# Patient Record
Sex: Male | Born: 1980 | Race: Black or African American | Hispanic: Yes | Marital: Married | Smoking: Never smoker
Health system: Southern US, Community
[De-identification: ages and names within clinical notes are randomized; demographics above are authoritative.]

## PROBLEM LIST (undated history)

## (undated) DIAGNOSIS — J45909 Unspecified asthma, uncomplicated: Secondary | ICD-10-CM

## (undated) HISTORY — PX: CATARACT EXTRACTION: SUR2

---

## 2006-10-21 ENCOUNTER — Emergency Department (HOSPITAL_COMMUNITY): Admission: EM | Admit: 2006-10-21 | Discharge: 2006-10-21 | Payer: Self-pay | Admitting: Emergency Medicine

## 2007-06-13 ENCOUNTER — Emergency Department (HOSPITAL_COMMUNITY): Admission: EM | Admit: 2007-06-13 | Discharge: 2007-06-13 | Payer: Self-pay | Admitting: Emergency Medicine

## 2018-09-26 ENCOUNTER — Encounter (HOSPITAL_COMMUNITY): Payer: Self-pay | Admitting: Urgent Care

## 2018-09-26 ENCOUNTER — Other Ambulatory Visit: Payer: Self-pay

## 2018-09-26 ENCOUNTER — Ambulatory Visit (HOSPITAL_COMMUNITY)
Admission: EM | Admit: 2018-09-26 | Discharge: 2018-09-26 | Disposition: A | Discharge status: Home / Self Care | Payer: 59 | Attending: Internal Medicine | Admitting: Internal Medicine

## 2018-09-26 DIAGNOSIS — B349 Viral infection, unspecified: Secondary | ICD-10-CM | POA: Diagnosis not present

## 2018-09-26 MED ORDER — DEXTROMETHORPHAN-BENZOCAINE 5-7.5 MG MT LOZG: 1.0000 | LOZENGE | OROMUCOSAL | 0 refills | Status: DC | PRN

## 2018-09-26 MED ORDER — SODIUM CHLORIDE 0.9 % IV BOLUS
1000.0000 mL | Freq: Once | INTRAVENOUS | Status: AC
  Administered 2018-09-26: 1000 mL via INTRAVENOUS

## 2018-09-26 NOTE — ED Triage Notes (Signed)
Pt cc coughing, headaches, body aches and fever  This started yesterday.

## 2018-09-26 NOTE — ED Provider Notes (Signed)
[facility]    CSN: [id number] Arrival date & time: 09/26/18  1609     History   Chief Complaint Chief Complaint  Patient presents with  . Influenza    HPI Mike Mason is a 38 y.o. male no past medical history comes to the urgent care department on account of 1 day history of runny nose, sore throat, generalized body aches with fever and chills.  Symptoms started yesterday and is gotten progressively worse today.  He admits to having a cough which is nonproductive.  No known aggravating or relieving factors.  Patient has not tried any over-the-counter medications.  He admits to having some dizziness with walking and had a near fainting episode earlier today.  He has no nausea or vomiting.   HPI  History reviewed. No pertinent past medical history.  There are no active problems to display for this patient.   History reviewed. No pertinent surgical history.     Home Medications    Prior to Admission medications   Medication Sig Start Date End Date Taking? Authorizing Provider  Dextromethorphan-Benzocaine (CEPACOL SORE THROAT & COUGH) 5-7.5 MG LOZG Use as directed 1 lozenge in the mouth or throat as needed. 09/26/18   Keishawn Darsey, Britta Mccreedy, MD    Family History History reviewed. No pertinent family history.  Social History Social History   Tobacco Use  . Smoking status: Never Smoker  . Smokeless tobacco: Never Used  Substance Use Topics  . Alcohol use: Never    Frequency: Never  . Drug use: Not on file     Allergies   Patient has no known allergies.   Review of Systems Review of Systems  Constitutional: Positive for chills and fatigue. Negative for activity change, appetite change and diaphoresis.  HENT: Positive for congestion and sore throat. Negative for ear discharge, ear pain, facial swelling and tinnitus.   Eyes: Negative for pain and itching.  Respiratory: Positive for cough. Negative for chest tightness, shortness of breath and wheezing.     Cardiovascular: Negative for chest pain and palpitations.  Gastrointestinal: Negative for abdominal distention and abdominal pain.  Musculoskeletal: Negative for arthralgias and gait problem.  Skin: Negative for color change, pallor and rash.  Neurological: Positive for dizziness and light-headedness. Negative for syncope.     Physical Exam Triage Vital Signs ED Triage Vitals  Enc Vitals Group     BP 09/26/18 1714 126/76     Pulse Rate 09/26/18 1714 (!) 106     Resp 09/26/18 1714 16     Temp 09/26/18 1714 98.3 F (36.8 C)     Temp src --      SpO2 09/26/18 1714 100 %     Weight 09/26/18 1715 156 lb (70.8 kg)     Height --      Head Circumference --      Peak Flow --      Pain Score 09/26/18 1715 7     Pain Loc --      Pain Edu? --      Excl. in GC? --    No data found.  Updated Vital Signs BP 126/76 (BP Location: Right Arm)   Pulse (!) 106   Temp 98.3 F (36.8 C)   Resp 16   Wt 70.8 kg   SpO2 100%   Visual Acuity Right Eye Distance:   Left Eye Distance:   Bilateral Distance:    Right Eye Near:   Left Eye Near:    Bilateral Near:  Physical Exam Constitutional:      Appearance: Normal appearance.  HENT:     Right Ear: Tympanic membrane normal.     Left Ear: Tympanic membrane normal.     Nose: Nose normal.     Mouth/Throat:     Mouth: Mucous membranes are dry.     Pharynx: No posterior oropharyngeal erythema.  Eyes:     Conjunctiva/sclera: Conjunctivae normal.     Pupils: Pupils are equal, round, and reactive to light.  Cardiovascular:     Rate and Rhythm: Tachycardia present.     Pulses: Normal pulses.     Heart sounds: No murmur. No gallop.   Pulmonary:     Effort: Pulmonary effort is normal.     Breath sounds: Normal breath sounds.  Abdominal:     General: Bowel sounds are normal. There is no distension.     Tenderness: There is no abdominal tenderness.  Musculoskeletal: Normal range of motion.  Lymphadenopathy:     Cervical: No cervical  adenopathy.  Skin:    General: Skin is warm.     Capillary Refill: Capillary refill takes less than 2 seconds.     Coloration: Skin is not jaundiced or pale.  Neurological:     Mental Status: He is alert.      UC Treatments / Results  Labs (all labs ordered are listed, but only abnormal results are displayed) Labs Reviewed - No data to display  EKG None  Radiology No results found.  Procedures Procedures (including critical care time)  Medications Ordered in UC Medications  sodium chloride 0.9 % bolus 1,000 mL (1,000 mLs Intravenous New Bag/Given 09/26/18 1753)    Initial Impression / Assessment and Plan / UC Course  I have reviewed the triage vital signs and the nursing notes.  Pertinent labs & imaging results that were available during my care of the patient were reviewed by me and considered in my medical decision making (see chart for details).     1.  Acute viral syndrome: Encourage oral fluid intake NSAIDs for pain/fever No indication for antibiotics at this time  2.  Sinus tachycardia likely secondary to acute viral illness: IV fluids-normal saline x1 L Discharge patient after IV fluids is complete  Final Clinical Impressions(s) / UC Diagnoses   Final diagnoses:  Viral illness   Discharge Instructions   None    ED Prescriptions    Medication Sig Dispense Auth. Provider   Dextromethorphan-Benzocaine (CEPACOL SORE THROAT & COUGH) 5-7.5 MG LOZG Use as directed 1 lozenge in the mouth or throat as needed.  Merrilee Jansky, MD     Controlled Substance Prescriptions [place] Controlled Substance Registry consulted? No   Merrilee Jansky, MD 09/26/18 [phone]

## 2019-02-13 ENCOUNTER — Ambulatory Visit (HOSPITAL_COMMUNITY)
Admission: EM | Admit: 2019-02-13 | Discharge: 2019-02-13 | Disposition: A | Discharge status: Home / Self Care | Payer: 59 | Attending: Family Medicine | Admitting: Family Medicine

## 2019-02-13 ENCOUNTER — Encounter (HOSPITAL_COMMUNITY): Payer: Self-pay | Admitting: Emergency Medicine

## 2019-02-13 ENCOUNTER — Other Ambulatory Visit: Payer: Self-pay

## 2019-02-13 DIAGNOSIS — L249 Irritant contact dermatitis, unspecified cause: Secondary | ICD-10-CM

## 2019-02-13 MED ORDER — METHYLPREDNISOLONE SODIUM SUCC 125 MG IJ SOLR
125.0000 mg | Freq: Once | INTRAMUSCULAR | Status: AC
  Administered 2019-02-13: 125 mg via INTRAMUSCULAR

## 2019-02-13 MED ORDER — PREDNISONE 10 MG (21) PO TBPK: ORAL_TABLET | Freq: Every day | ORAL | 0 refills | Status: DC

## 2019-02-13 MED ORDER — CETIRIZINE HCL 10 MG PO CAPS: 10.0000 mg | ORAL_CAPSULE | Freq: Every day | ORAL | 0 refills | Status: DC

## 2019-02-13 MED ORDER — METHYLPREDNISOLONE SODIUM SUCC 125 MG IJ SOLR
INTRAMUSCULAR | Status: AC
  Filled 2019-02-13: qty 2

## 2019-02-13 MED ORDER — CEPHALEXIN 500 MG PO CAPS: 500.0000 mg | ORAL_CAPSULE | Freq: Four times a day (QID) | ORAL | 0 refills | Status: AC

## 2019-02-13 MED ORDER — TRIAMCINOLONE ACETONIDE 0.1 % EX CREA: 1.0000 "application " | TOPICAL_CREAM | Freq: Two times a day (BID) | CUTANEOUS | 0 refills | Status: DC

## 2019-02-13 NOTE — Discharge Instructions (Signed)
We gave you a shot of Solu-Medrol today, continue with prednisone taper beginning tomorrow.  Begin with 6 tablets, decrease by 1 tablet each day until you take 1 tablet on day 6 Begin Keflex 4 times a day for the next 5 days to cover infection May apply Kenalog cream twice daily and thin amount to areas of significant itching May also use antihistamines to further help with itching-Daily cetirizine/Zyrtec in the morning, may supplement with Benadryl at nighttime  Please follow-up if rash changing, worsening, developing fevers, nausea, vomiting, increased redness swelling or pain, drainage, not improving with above

## 2019-02-13 NOTE — ED Triage Notes (Signed)
Pt here for rash to arms after cutting down tree

## 2019-02-13 NOTE — ED Provider Notes (Signed)
[facility]    CSN: [id number] Arrival date & time: 02/13/19  [id number]      History   Chief Complaint Chief Complaint  Patient presents with   Rash    HPI Mike Mason is a 38 y.o. male no significant past medical history presenting today for evaluation of a rash.  Patient states that on 6/12, approximately 10 days ago he was cutting down a tree/brush.  Afterwards he developed small bumps to his forearms.  Since this rash has progressed and has become more red and noted more swelling to his left forearm.  Rash is mainly been associated with itching, but of recently has developed slightly more pain.  Has also noticed rash on his right forearm as well as left side.  He has been using calamine as well as over-the-counter hydrocortisone without full relief.  Denies any fevers chills or body aches.  Denies nausea or vomiting.  HPI  History reviewed. No pertinent past medical history.  There are no active problems to display for this patient.   History reviewed. No pertinent surgical history.     Home Medications    Prior to Admission medications   Medication Sig Start Date End Date Taking? Authorizing Provider  cephALEXin (KEFLEX) 500 MG capsule Take 1 capsule (500 mg total) by mouth 4 (four) times daily for 5 days. 02/13/19 02/18/19  Tramayne Sebesta C, PA-C  Cetirizine HCl 10 MG CAPS Take 1 capsule (10 mg total) by mouth daily for 10 days. 02/13/19 02/23/19  Gabriela Giannelli C, PA-C  Dextromethorphan-Benzocaine (CEPACOL SORE THROAT & COUGH) 5-7.5 MG LOZG Use as directed 1 lozenge in the mouth or throat as needed. 09/26/18   Lamptey, Britta MccreedyPhilip O, MD  predniSONE (STERAPRED UNI-PAK 21 TAB) 10 MG (21) TBPK tablet Take by mouth daily. Take as directed 02/13/19   Kaycee Mcgaugh C, PA-C  triamcinolone cream (KENALOG) 0.1 % Apply 1 application topically 2 (two) times daily. 02/13/19   Lindora Alviar, Junius CreamerHallie C, PA-C    Family History Family History  Problem Relation Age of Onset   Healthy  Mother    Healthy Father     Social History Social History   Tobacco Use   Smoking status: Never Smoker   Smokeless tobacco: Never Used  Substance Use Topics   Alcohol use: Never    Frequency: Never   Drug use: Not on file     Allergies   Patient has no known allergies.   Review of Systems Review of Systems  Constitutional: Negative for fatigue and fever.  Eyes: Negative for redness, itching and visual disturbance.  Respiratory: Negative for shortness of breath.   Cardiovascular: Negative for chest pain and leg swelling.  Gastrointestinal: Negative for nausea and vomiting.  Musculoskeletal: Negative for arthralgias and myalgias.  Skin: Positive for color change and rash. Negative for wound.  Neurological: Negative for dizziness, syncope, weakness, light-headedness and headaches.     Physical Exam Triage Vital Signs ED Triage Vitals [02/13/19 1021]  Enc Vitals Group     BP (!) 126/99     Pulse Rate 82     Resp 18     Temp 98.4 F (36.9 C)     Temp Source Oral     SpO2 99 %     Weight      Height      Head Circumference      Peak Flow      Pain Score 3     Pain Loc  Pain Edu?      Excl. in [place]?    No data found.  Updated Vital Signs BP (!) 126/99 (BP Location: Right Arm)    Pulse 82    Temp 98.4 F (36.9 C) (Oral)    Resp 18    SpO2 99%   Visual Acuity Right Eye Distance:   Left Eye Distance:   Bilateral Distance:    Right Eye Near:   Left Eye Near:    Bilateral Near:     Physical Exam Vitals signs and nursing note reviewed.  Constitutional:      Appearance: He is well-developed.     Comments: No acute distress  HENT:     Head: Normocephalic and atraumatic.     Nose: Nose normal.     Mouth/Throat:     Comments: Oral mucosa pink and moist, no tonsillar enlargement or exudate. Posterior pharynx patent and nonerythematous, no uvula deviation or swelling. Normal phonation. No lesions on oral mucosa Eyes:     Conjunctiva/sclera:  Conjunctivae normal.     Comments: Wearing glasses  Neck:     Musculoskeletal: Neck supple.  Cardiovascular:     Rate and Rhythm: Normal rate.  Pulmonary:     Effort: Pulmonary effort is normal. No respiratory distress.     Comments: Breathing comfortably at rest, CTABL, no wheezing, rales or other adventitious sounds auscultated Abdominal:     General: There is no distension.  Musculoskeletal: Normal range of motion.  Skin:    General: Skin is warm and dry.     Comments: Left forearm with multiple clustered area of vesicular lesions with surrounding erythema, mild swelling noted-see pictures below  Small area of erythematous vesicular/papular lesions to right forearm as well as across left flank/abdomen.  Neurological:     Mental Status: He is alert and oriented to person, place, and time.          UC Treatments / Results  Labs (all labs ordered are listed, but only abnormal results are displayed) Labs Reviewed - No data to display  EKG None  Radiology No results found.  Procedures Procedures (including critical care time)  Medications Ordered in UC Medications  methylPREDNISolone sodium succinate (SOLU-MEDROL) 125 mg/2 mL injection 125 mg (125 mg Intramuscular Given 02/13/19 1056)  methylPREDNISolone sodium succinate (SOLU-MEDROL) 125 mg/2 mL injection (has no administration in time range)    Initial Impression / Assessment and Plan / UC Course  I have reviewed the triage vital signs and the nursing notes.  Pertinent labs & imaging results that were available during my care of the patient were reviewed by me and considered in my medical decision making (see chart for details).     Appears to have a contact dermatitis, similar appearance to poison ivy given linear distribution on left forearm and clustered vesicular appearance.  Significant erythema associated, will cover for infection with Keflex.  Will provide Solu-Medrol IM today and followed by prednisone  taper over 6 days.  Antihistamines, topical Kenalog as needed for significant areas of itching.  Continue to monitor,Discussed strict return precautions. Patient verbalized understanding and is agreeable with plan.  Final Clinical Impressions(s) / UC Diagnoses   Final diagnoses:  Irritant contact dermatitis, unspecified trigger     Discharge Instructions     We gave you a shot of Solu-Medrol today, continue with prednisone taper beginning tomorrow.  Begin with 6 tablets, decrease by 1 tablet each day until you take 1 tablet on day 6 Begin Keflex 4 times a  day for the next 5 days to cover infection May apply Kenalog cream twice daily and thin amount to areas of significant itching May also use antihistamines to further help with itching-Daily cetirizine/Zyrtec in the morning, may supplement with Benadryl at nighttime  Please follow-up if rash changing, worsening, developing fevers, nausea, vomiting, increased redness swelling or pain, drainage, not improving with above   ED Prescriptions    Medication Sig Dispense Auth. Provider   predniSONE (STERAPRED UNI-PAK 21 TAB) 10 MG (21) TBPK tablet Take by mouth daily. Take as directed 21 tablet Calleen Alvis C, PA-C   triamcinolone cream (KENALOG) 0.1 % Apply 1 application topically 2 (two) times daily. 30 g Shiquan Mathieu C, PA-C   Cetirizine HCl 10 MG CAPS Take 1 capsule (10 mg total) by mouth daily for 10 days. 10 capsule Souleymane Saiki C, PA-C   cephALEXin (KEFLEX) 500 MG capsule Take 1 capsule (500 mg total) by mouth 4 (four) times daily for 5 days. 20 capsule Bobbie Virden C, PA-C     Controlled Substance Prescriptions [place] Controlled Substance Registry consulted? Not Applicable   Lew DawesWieters, Ammiel Guiney C, [place]PA-C 02/13/19 1232

## 2019-04-07 ENCOUNTER — Encounter (HOSPITAL_COMMUNITY): Payer: Self-pay

## 2019-04-07 ENCOUNTER — Other Ambulatory Visit: Payer: Self-pay

## 2019-04-07 ENCOUNTER — Ambulatory Visit (HOSPITAL_COMMUNITY)
Admission: EM | Admit: 2019-04-07 | Discharge: 2019-04-07 | Disposition: A | Discharge status: Home / Self Care | Payer: 59 | Attending: Urgent Care | Admitting: Urgent Care

## 2019-04-07 DIAGNOSIS — Z20828 Contact with and (suspected) exposure to other viral communicable diseases: Secondary | ICD-10-CM | POA: Insufficient documentation

## 2019-04-07 DIAGNOSIS — B029 Zoster without complications: Secondary | ICD-10-CM | POA: Insufficient documentation

## 2019-04-07 HISTORY — DX: Unspecified asthma, uncomplicated: J45.909

## 2019-04-07 MED ORDER — VALACYCLOVIR HCL 1 G PO TABS: 1000.0000 mg | ORAL_TABLET | Freq: Three times a day (TID) | ORAL | 0 refills | Status: DC

## 2019-04-07 NOTE — ED Triage Notes (Signed)
Pt presents with complaints of swelling and tenderness to his groin. Requesting a male provider. States that he has also had fevers, chills, body aches and elevated heart rate x 4 days.

## 2019-04-07 NOTE — ED Provider Notes (Signed)
[facility]    CSN: [id number] Arrival date & time: 04/07/19  1445      History   Chief Complaint Chief Complaint  Patient presents with  . Groin Swelling    HPI Mike Mason is a 38 y.o. male.   Patient here concerned with ulcer on penis x 4 - 5 days.  Notes additional small "bumps" appeared around it.  Notes his R leg is painful and tender to light touch.  Denies any lesions on leg.  Notes R inguinal tenderness as well. He reports chills without fever, myalgia, and "elevated heart rate" which he defines as 90 BPM per his heart rate monitor on his watch. Denies dysuria, frequency, urgency, penile d/c, testicular swelling, erythema ,discharge.  Patient reports sexually active, 1 male partner.  He reports his wife is also faithful.  No known exposures to STDs.       Past Medical History:  Diagnosis Date  . Asthma     There are no active problems to display for this patient.   History reviewed. No pertinent surgical history.     Home Medications    Prior to Admission medications   Medication Sig Start Date End Date Taking? Authorizing Provider  Cetirizine HCl 10 MG CAPS Take 1 capsule (10 mg total) by mouth daily for 10 days. 02/13/19 02/23/19  Wieters, Hallie C, Mike Mason  Dextromethorphan-Benzocaine (CEPACOL SORE THROAT & COUGH) 5-7.5 MG LOZG Use as directed 1 lozenge in the mouth or throat as needed. 09/26/18   Lamptey, Britta MccreedyPhilip O, MD  predniSONE (STERAPRED UNI-PAK 21 TAB) 10 MG (21) TBPK tablet Take by mouth daily. Take as directed 02/13/19   Wieters, Hallie C, Mike Mason  triamcinolone cream (KENALOG) 0.1 % Apply 1 application topically 2 (two) times daily. 02/13/19   Wieters, Hallie C, Mike Mason  valACYclovir (VALTREX) 1000 MG tablet Take 1 tablet (1,000 mg total) by mouth 3 (three) times daily. 04/07/19   Evern CoreLindquist, Benton Tooker, Mike Mason    Family History Family History  Problem Relation Age of Onset  . Healthy Mother   . Healthy Father     Social History Social History    Tobacco Use  . Smoking status: Never Smoker  . Smokeless tobacco: Never Used  Substance Use Topics  . Alcohol use: Never    Frequency: Never  . Drug use: Not on file     Allergies   Patient has no known allergies.   Review of Systems Review of Systems  Constitutional: Positive for chills. Negative for activity change and fatigue.  Cardiovascular: Negative for chest pain, palpitations and leg swelling.  Gastrointestinal: Negative for abdominal distention, abdominal pain, diarrhea, nausea and vomiting.  Genitourinary: Positive for genital sores and penile pain. Negative for decreased urine volume, difficulty urinating, discharge, dysuria, flank pain, frequency, hematuria, penile swelling, scrotal swelling, testicular pain and urgency.  Musculoskeletal: Positive for myalgias. Negative for arthralgias.  Skin: Positive for rash. Negative for color change, pallor and wound.  Neurological: Negative for dizziness and light-headedness.  Psychiatric/Behavioral: Negative for confusion and sleep disturbance.     Physical Exam Triage Vital Signs ED Triage Vitals  Enc Vitals Group     BP 04/07/19 1544 (!) 149/105     Pulse Rate 04/07/19 1544 95     Resp 04/07/19 1544 18     Temp 04/07/19 1544 98.9 F (37.2 C)     Temp src --      SpO2 04/07/19 1544 100 %     Weight --  Height --      Head Circumference --      Peak Flow --      Pain Score 04/07/19 1545 10     Pain Loc --      Pain Edu? --      Excl. in GC? --    No data found.  Updated Vital Signs BP (!) 149/105   Pulse 95   Temp 98.9 F (37.2 C)   Resp 18   SpO2 100%   Visual Acuity Right Eye Distance:   Left Eye Distance:   Bilateral Distance:    Right Eye Near:   Left Eye Near:    Bilateral Near:     Physical Exam Vitals signs and nursing note reviewed.  Constitutional:      Appearance: Normal appearance. He is well-developed.  HENT:     Head: Normocephalic and atraumatic.     Nose: Nose normal.   Eyes:     General: No scleral icterus.    Extraocular Movements: Extraocular movements intact.     Conjunctiva/sclera: Conjunctivae normal.     Pupils: Pupils are equal, round, and reactive to light.  Neck:     Musculoskeletal: Neck supple.  Cardiovascular:     Rate and Rhythm: Normal rate and regular rhythm.     Heart sounds: No murmur.  Pulmonary:     Effort: Pulmonary effort is normal. No respiratory distress.     Breath sounds: Normal breath sounds.  Abdominal:     Palpations: Abdomen is soft.     Tenderness: There is no abdominal tenderness.  Genitourinary:    Penis: Lesions (3 mm ulcerative lesion shaft of R side of penis with 3 small nodules noted, TTP, no expressable d/c noted) present.      Scrotum/Testes:        Right: Mass, tenderness or swelling not present.        Left: Mass, tenderness or swelling not present.  Musculoskeletal: Normal range of motion.  Lymphadenopathy:     Lower Body: Right inguinal adenopathy present.  Skin:    General: Skin is warm and dry.     Capillary Refill: Capillary refill takes less than 2 seconds.  Neurological:     General: No focal deficit present.     Mental Status: He is alert and oriented to person, place, and time.     Motor: No weakness.     Gait: Gait normal.  Psychiatric:        Mood and Affect: Mood is anxious.        Speech: Speech normal.        Behavior: Behavior normal.        Thought Content: Thought content normal.      UC Treatments / Results  Labs (all labs ordered are listed, but only abnormal results are displayed) Labs Reviewed  NOVEL CORONAVIRUS, NAA (HOSPITAL ORDER, SEND-OUT TO REF LAB)  HSV CULTURE AND TYPING  RPR  HIV ANTIBODY (ROUTINE TESTING W REFLEX)  HSV(HERPES SIMPLEX VRS) I + II AB-IGM  HSV(HERPES SIMPLEX VRS) I + II AB-IGG  URINE CYTOLOGY ANCILLARY ONLY    EKG   Radiology No results found.  Procedures Procedures (including critical care time)  Medications Ordered in UC Medications  - No data to display  Initial Impression / Assessment and Plan / UC Course  I have reviewed the triage vital signs and the nursing notes.  Pertinent labs & imaging results that were available during my care of the patient were reviewed  by me and considered in my medical decision making (see chart for details).     Will complete STD testing and swab for HSV Discussed possibility of STDs with patient, including chancroid. Will treat for zoster at this time due to multiple small lesions and numbness / tingling of R anterior thigh, pending lab results Final Clinical Impressions(s) / UC Diagnoses   Final diagnoses:  Herpes zoster without complication     Discharge Instructions     Treating for shingles based on symptoms. We will call with test results.    ED Prescriptions    Medication Sig Dispense Auth. Provider   valACYclovir (VALTREX) 1000 MG tablet Take 1 tablet (1,000 mg total) by mouth 3 (three) times daily. 21 tablet Mike Jefferson, Mike Mason     Controlled Substance Prescriptions [place] Controlled Substance Registry consulted? Not Applicable   Mike Jefferson, Mike Mason 04/07/19 1640

## 2019-04-07 NOTE — Discharge Instructions (Addendum)
Treating for shingles based on symptoms. We will call with test results.

## 2019-04-08 LAB — RPR: RPR Ser Ql: NONREACTIVE

## 2019-04-08 LAB — HIV ANTIBODY (ROUTINE TESTING W REFLEX): HIV Screen 4th Generation wRfx: NONREACTIVE

## 2019-04-09 LAB — NOVEL CORONAVIRUS, NAA (HOSP ORDER, SEND-OUT TO REF LAB; TAT 18-24 HRS): SARS-CoV-2, NAA: NOT DETECTED

## 2019-04-10 LAB — HSV CULTURE AND TYPING

## 2019-04-10 LAB — HSV(HERPES SIMPLEX VRS) I + II AB-IGG
HSV 1 Glycoprotein G Ab, IgG: <0.91 index (ref 0.00–0.90)
HSV 2 Glycoprotein G Ab, IgG: <0.91 index (ref 0.00–0.90)

## 2019-04-11 LAB — HSV(HERPES SIMPLEX VRS) I + II AB-IGM: HSVI/II Comb IgM: 1.04 Ratio — ABNORMAL HIGH (ref 0.00–0.90)

## 2019-04-11 LAB — URINE CYTOLOGY ANCILLARY ONLY
Chlamydia: NEGATIVE
Neisseria Gonorrhea: NEGATIVE
Trichomonas: NEGATIVE

## 2020-01-21 ENCOUNTER — Other Ambulatory Visit: Payer: Self-pay

## 2020-01-21 ENCOUNTER — Encounter (HOSPITAL_BASED_OUTPATIENT_CLINIC_OR_DEPARTMENT_OTHER): Payer: Self-pay | Admitting: *Deleted

## 2020-01-21 ENCOUNTER — Emergency Department (HOSPITAL_BASED_OUTPATIENT_CLINIC_OR_DEPARTMENT_OTHER)
Admission: EM | Admit: 2020-01-21 | Discharge: 2020-01-21 | Disposition: A | Discharge status: Home / Self Care | Payer: 59 | Attending: Emergency Medicine | Admitting: Emergency Medicine

## 2020-01-21 ENCOUNTER — Emergency Department (HOSPITAL_BASED_OUTPATIENT_CLINIC_OR_DEPARTMENT_OTHER): Payer: 59

## 2020-01-21 DIAGNOSIS — R1032 Left lower quadrant pain: Secondary | ICD-10-CM | POA: Insufficient documentation

## 2020-01-21 DIAGNOSIS — J45909 Unspecified asthma, uncomplicated: Secondary | ICD-10-CM | POA: Diagnosis not present

## 2020-01-21 DIAGNOSIS — R109 Unspecified abdominal pain: Secondary | ICD-10-CM

## 2020-01-21 LAB — URINALYSIS, ROUTINE W REFLEX MICROSCOPIC
Bilirubin Urine: NEGATIVE
Glucose, UA: 100 mg/dL — AB
Ketones, ur: NEGATIVE mg/dL
Leukocytes,Ua: NEGATIVE
Nitrite: NEGATIVE
Protein, ur: NEGATIVE mg/dL
Specific Gravity, Urine: 1.025 (ref 1.005–1.030)
pH: 6 (ref 5.0–8.0)

## 2020-01-21 LAB — URINALYSIS, MICROSCOPIC (REFLEX): WBC, UA: NONE SEEN WBC/hpf (ref 0–5)

## 2020-01-21 MED ORDER — CIPROFLOXACIN HCL 500 MG PO TABS
500.0000 mg | ORAL_TABLET | Freq: Once | ORAL | Status: AC
  Administered 2020-01-21: 500 mg via ORAL
  Filled 2020-01-21: qty 1

## 2020-01-21 MED ORDER — CIPROFLOXACIN HCL 500 MG PO TABS: 500.0000 mg | ORAL_TABLET | Freq: Two times a day (BID) | ORAL | 0 refills | Status: AC

## 2020-01-21 MED ORDER — METRONIDAZOLE 500 MG PO TABS: 500.0000 mg | ORAL_TABLET | Freq: Three times a day (TID) | ORAL | 0 refills | Status: AC

## 2020-01-21 MED ORDER — METRONIDAZOLE 500 MG PO TABS
500.0000 mg | ORAL_TABLET | Freq: Once | ORAL | Status: AC
  Administered 2020-01-21: 500 mg via ORAL
  Filled 2020-01-21: qty 1

## 2020-01-21 NOTE — ED Provider Notes (Signed)
[facility] EMERGENCY DEPARTMENT Provider Note   CSN: [id number] Arrival date & time: 01/21/20  1730     History Chief Complaint  Patient presents with  . Flank Pain    Mike Mason is a 39 y.o. male presented to emergency department with left lower sided back pain and flank pain.  Reports onset approximately 2 days ago.  No reported trauma.  He is having a sharp and cramping pain in his left flank which radiates around towards his left lower abdomen.  He said he had 2 bowel movements yesterday and felt like the stool color was a different consistency.  He reports that he has had similar episodes intermittently in the past of abdominal cramping pain, usually relieved after bowel movement.  He does state that he is lactose intolerant and had 2 pieces of [employer] cheesecake the past 2 days.  He is not sure whether this may be contributing to his symptoms.  He also reports he was seen in another emergency department 2 or 3 years ago and had a CT scan and was told "there is something up with my intestines".  He does not recall the diagnosis.  He denies any fevers or chills.  He denies any vomiting.  He does intermittently have bloating, worse in the morning.  He denies any personal family history of kidney stones.  He denies any dysuria.  HPI     Past Medical History:  Diagnosis Date  . Asthma     There are no problems to display for this patient.   History reviewed. No pertinent surgical history.     Family History  Problem Relation Age of Onset  . Healthy Mother   . Healthy Father     Social History   Tobacco Use  . Smoking status: Never Smoker  . Smokeless tobacco: Never Used  Substance Use Topics  . Alcohol use: Never  . Drug use: Not on file    Home Medications Prior to Admission medications   Medication Sig Start Date End Date Taking? Authorizing Provider  Cetirizine HCl 10 MG CAPS Take 1 capsule (10 mg total) by mouth daily for 10 days. 02/13/19  02/23/19  Wieters, Hallie C, PA-C  ciprofloxacin (CIPRO) 500 MG tablet Take 1 tablet (500 mg total) by mouth every 12 (twelve) hours for 6 days. 01/22/20 01/28/20  Terald Sleeper, MD  Dextromethorphan-Benzocaine (CEPACOL SORE THROAT & COUGH) 5-7.5 MG LOZG Use as directed 1 lozenge in the mouth or throat as needed. 09/26/18   Lamptey, Britta Mccreedy, MD  metroNIDAZOLE (FLAGYL) 500 MG tablet Take 1 tablet (500 mg total) by mouth 3 (three) times daily for 6 days. 01/22/20 01/28/20  Terald Sleeper, MD  predniSONE (STERAPRED UNI-PAK 21 TAB) 10 MG (21) TBPK tablet Take by mouth daily. Take as directed 02/13/19   Wieters, Hallie C, PA-C  triamcinolone cream (KENALOG) 0.1 % Apply 1 application topically 2 (two) times daily. 02/13/19   Wieters, Hallie C, PA-C  valACYclovir (VALTREX) 1000 MG tablet Take 1 tablet (1,000 mg total) by mouth 3 (three) times daily. 04/07/19   Evern Core, PA-C    Allergies    Patient has no known allergies.  Review of Systems   Review of Systems  Constitutional: Negative for chills and fever.  Eyes: Negative for pain and visual disturbance.  Respiratory: Negative for cough and shortness of breath.   Cardiovascular: Negative for chest pain and palpitations.  Gastrointestinal: Positive for abdominal pain and blood in stool. Negative for  nausea and vomiting.  Genitourinary: Positive for flank pain. Negative for dysuria, hematuria, penile swelling and scrotal swelling.  Musculoskeletal: Positive for back pain. Negative for arthralgias.  Skin: Negative for color change and rash.  Neurological: Negative for syncope and light-headedness.  Psychiatric/Behavioral: Negative for agitation and confusion.  All other systems reviewed and are negative.   Physical Exam Updated Vital Signs BP (!) 113/92 (BP Location: Right Arm)   Pulse 79   Temp 98.9 F (37.2 C)   Resp 16   Ht 5\' 7"  (1.702 m)   Wt 70.8 kg   SpO2 99%   BMI 24.43 kg/m   Physical Exam Vitals and nursing note reviewed.   Constitutional:      Appearance: He is well-developed.  HENT:     Head: Normocephalic and atraumatic.  Eyes:     Conjunctiva/sclera: Conjunctivae normal.  Cardiovascular:     Rate and Rhythm: Normal rate and regular rhythm.     Pulses: Normal pulses.  Pulmonary:     Effort: Pulmonary effort is normal. No respiratory distress.  Abdominal:     General: There is no distension.     Palpations: Abdomen is soft.     Tenderness: There is no abdominal tenderness. There is no right CVA tenderness, left CVA tenderness or guarding.  Musculoskeletal:        General: No swelling or tenderness. Normal range of motion.     Cervical back: Neck supple.  Skin:    General: Skin is warm and dry.  Neurological:     General: No focal deficit present.     Mental Status: He is alert and oriented to person, place, and time.  Psychiatric:        Mood and Affect: Mood normal.        Behavior: Behavior normal.     ED Results / Procedures / Treatments   Labs (all labs ordered are listed, but only abnormal results are displayed) Labs Reviewed  URINALYSIS, ROUTINE W REFLEX MICROSCOPIC - Abnormal; Notable for the following components:      Result Value   Glucose, UA 100 (*)    Hgb urine dipstick SMALL (*)    All other components within normal limits  URINALYSIS, MICROSCOPIC (REFLEX) - Abnormal; Notable for the following components:   Bacteria, UA RARE (*)    All other components within normal limits    EKG None  Radiology CT Renal Stone Study  Result Date: 01/21/2020 CLINICAL DATA:  Per ED notes: Left flank pain x 2 days. Increased pain with palpation, movement. EXAM: CT ABDOMEN AND PELVIS WITHOUT CONTRAST TECHNIQUE: Multidetector CT imaging of the abdomen and pelvis was performed following the standard protocol without IV contrast. COMPARISON:  None. FINDINGS: Lower chest: No acute abnormality. Evaluation of the abdominal viscera somewhat limited by the lack of IV contrast. Hepatobiliary: No focal  liver abnormality is seen. No gallstones, gallbladder wall thickening, or biliary dilatation. Pancreas: Unremarkable. No surrounding inflammatory changes. Spleen: Normal in size without focal abnormality. Adrenals/Urinary Tract: Adrenal glands are unremarkable. Kidneys are symmetric without renal calculi or hydronephrosis. Bladder is unremarkable. Stomach/Bowel: Stomach is within normal limits. Appendix appears normal. No evidence of bowel wall thickening, distention, or inflammatory changes. Vascular/Lymphatic: No significant vascular findings are present. No enlarged abdominal or pelvic lymph nodes. Reproductive: Prostate is unremarkable. Other: No abdominal wall hernia or abnormality. No abdominopelvic ascites. Musculoskeletal: Degenerative disc disease in the lower lumbar spine. IMPRESSION: No evidence of acute intra-abdominal pathology on a noncontrast study. Electronically Signed  By: Audie Pinto M.D.   On: 01/21/2020 20:03    Procedures Procedures (including critical care time)  Medications Ordered in ED Medications  ciprofloxacin (CIPRO) tablet 500 mg (500 mg Oral Given 01/21/20 2044)  metroNIDAZOLE (FLAGYL) tablet 500 mg (500 mg Oral Given 01/21/20 2044)    ED Course  I have reviewed the triage vital signs and the nursing notes.  Pertinent labs & imaging results that were available during my care of the patient were reviewed by me and considered in my medical decision making (see chart for details).  39 yo male presenting with LLQ abdominal pain and flank pain. Trace hgb in urine today Clinically this could be consistent with kidney stone.  Ordered CT renal study which showed no stone, and no focal abnormalities. Not protocalized for bowel evaluation - diverticulitis/colitis remains on the differential.  He was extremely well appearing with minimal to no ttp on exam.  I did not feel we need bloodwork but felt we could treat empircally with a course of antibiotics for colitis.     This may also have been IBS or lactose-intolerance reaction to the cheesecake he ate recently.   Doubtful of appendicitis, testicular torsion, pyelonephritis, or AAA  Clinical Course as of Jan 20 2250  Sun Jan 21, 2020  2018 IMPRESSION: No evidence of acute intra-abdominal pathology on a noncontrast study.   [MT]  2036 No acute findings of kidney stone on CT.  However based on the patient's description location of pain, I do think colitis remains on the differential.  Reasonable to try a course of 7 days of cipro + flagyl.  He is concerned about the chronic pain has been having his lower back.  I do think needs establish care with his PCP and will give him the wellness clinic.  He verbalized understanding.   [MT]    Clinical Course User Index [MT] Trifan, Carola Rhine, MD    Final Clinical Impression(s) / ED Diagnoses Final diagnoses:  Flank pain    Rx / DC Orders ED Discharge Orders         Ordered    ciprofloxacin (CIPRO) 500 MG tablet  Every 12 hours     01/21/20 2040    metroNIDAZOLE (FLAGYL) 500 MG tablet  3 times daily     01/21/20 2040           Wyvonnia Dusky, MD 01/21/20 2251

## 2020-01-21 NOTE — ED Notes (Signed)
Pt to CT

## 2020-01-21 NOTE — ED Triage Notes (Signed)
Left flank pain x 2 days. Increased pain with palpation, movement.

## 2020-01-21 NOTE — Discharge Instructions (Addendum)
Your CT scan today did not show kidney stones or signs of tumor.  However, this is not an exhaustive workup for back pain.  You should establish care with a primary care provider for more workup.  Call the wellness center at the number above.  We agreed to start treatment for possible diverticulitis today - this means inflammation of your bowels.  You can complete the full course of antibiotics at home, starting tomorrow morning.  Do NOT drink alcohol while taking Flagyl.  It will make you very sick.

## 2020-05-15 ENCOUNTER — Ambulatory Visit: Payer: 59 | Admitting: Psychology

## 2020-05-21 ENCOUNTER — Ambulatory Visit (INDEPENDENT_AMBULATORY_CARE_PROVIDER_SITE_OTHER): Payer: 59 | Admitting: Psychologist

## 2020-05-21 DIAGNOSIS — F411 Generalized anxiety disorder: Secondary | ICD-10-CM | POA: Diagnosis not present

## 2020-05-21 DIAGNOSIS — F330 Major depressive disorder, recurrent, mild: Secondary | ICD-10-CM | POA: Diagnosis not present

## 2020-05-31 ENCOUNTER — Ambulatory Visit (INDEPENDENT_AMBULATORY_CARE_PROVIDER_SITE_OTHER): Payer: 59 | Admitting: Psychologist

## 2020-05-31 DIAGNOSIS — F411 Generalized anxiety disorder: Secondary | ICD-10-CM | POA: Diagnosis not present

## 2020-05-31 DIAGNOSIS — F330 Major depressive disorder, recurrent, mild: Secondary | ICD-10-CM

## 2020-06-06 ENCOUNTER — Ambulatory Visit (INDEPENDENT_AMBULATORY_CARE_PROVIDER_SITE_OTHER): Payer: 59 | Admitting: Psychologist

## 2020-06-06 DIAGNOSIS — F330 Major depressive disorder, recurrent, mild: Secondary | ICD-10-CM

## 2020-06-06 DIAGNOSIS — F411 Generalized anxiety disorder: Secondary | ICD-10-CM

## 2020-06-11 ENCOUNTER — Ambulatory Visit: Payer: 59 | Admitting: Psychologist

## 2020-06-18 ENCOUNTER — Ambulatory Visit (INDEPENDENT_AMBULATORY_CARE_PROVIDER_SITE_OTHER): Payer: 59 | Admitting: Psychologist

## 2020-06-18 DIAGNOSIS — F330 Major depressive disorder, recurrent, mild: Secondary | ICD-10-CM

## 2020-06-18 DIAGNOSIS — F411 Generalized anxiety disorder: Secondary | ICD-10-CM | POA: Diagnosis not present

## 2020-07-02 ENCOUNTER — Ambulatory Visit: Payer: 59 | Admitting: Psychologist

## 2020-12-27 IMAGING — CT CT RENAL STONE PROTOCOL
2 of 4 series · 17 of 46 positions shown, 19 images · non-contrast
Comparison: None.

CLINICAL DATA: Per ED notes: Left flank pain x 2 days. Increased
pain with palpation, movement.

EXAM:
CT ABDOMEN AND PELVIS WITHOUT CONTRAST
TECHNIQUE: Multidetector CT imaging of the abdomen and pelvis was performed
following the standard protocol without IV contrast.

[Series 2: axial st · axial · 0.74mm/px · z∈[+552,+946]mm · 14 of 87 slices shown, 16 images]
[im 4/87  soft-tissue]
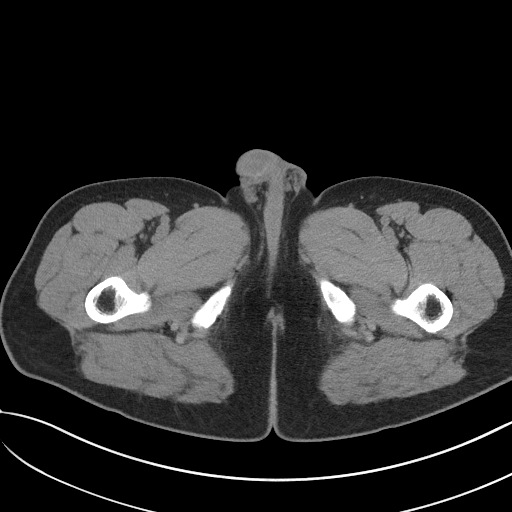
[im 4/87  bone]
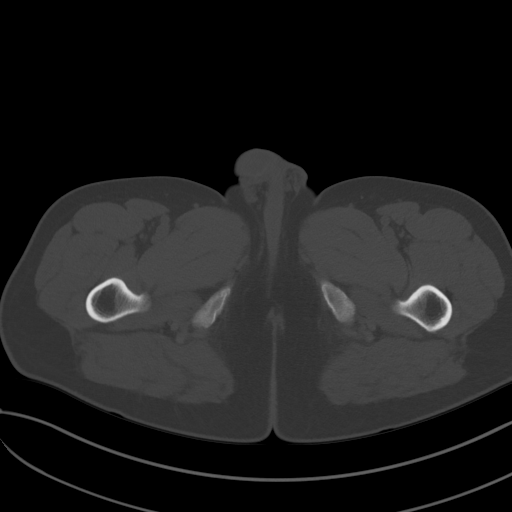
[im 11/87  soft-tissue]
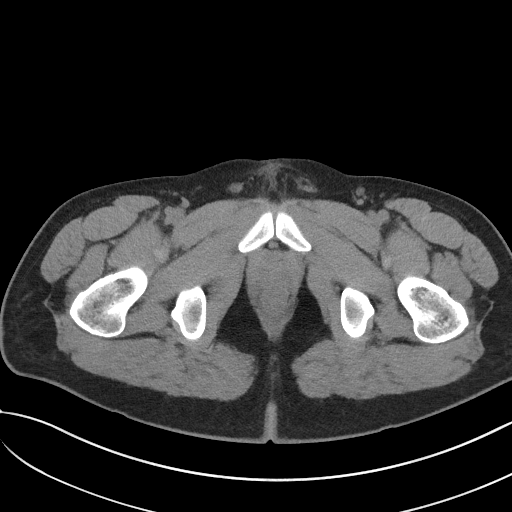
[im 18/87  soft-tissue]
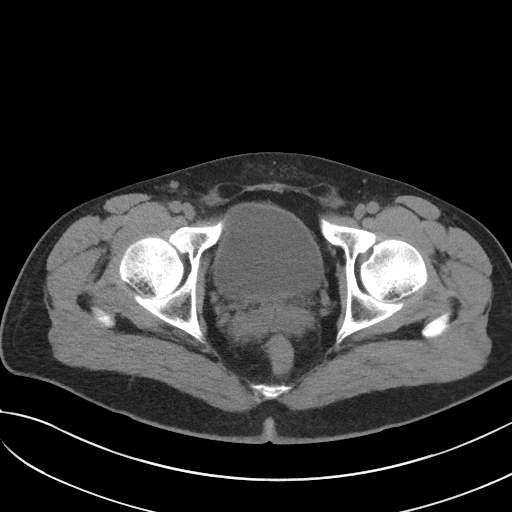
[im 22/87  soft-tissue]
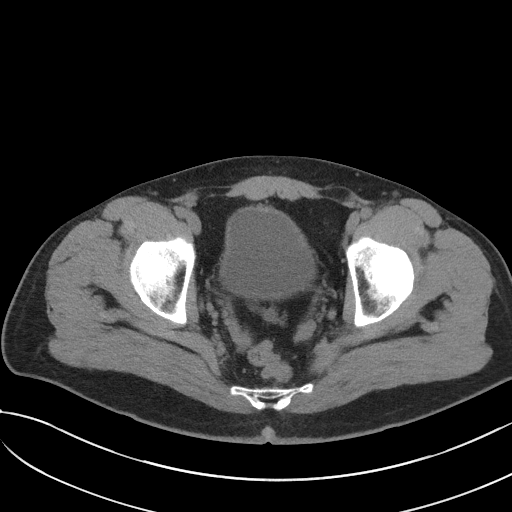
[im 29/87  soft-tissue]
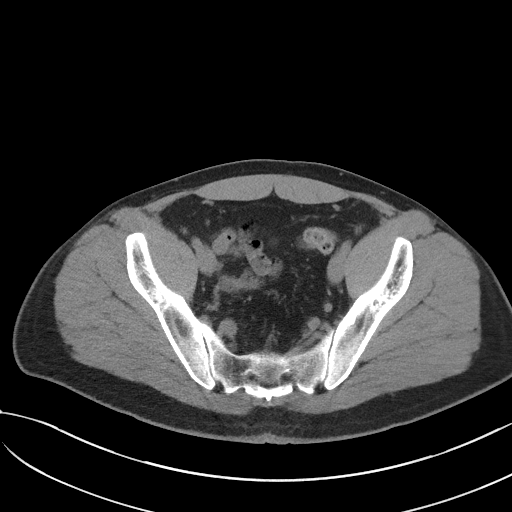
[im 36/87  soft-tissue]
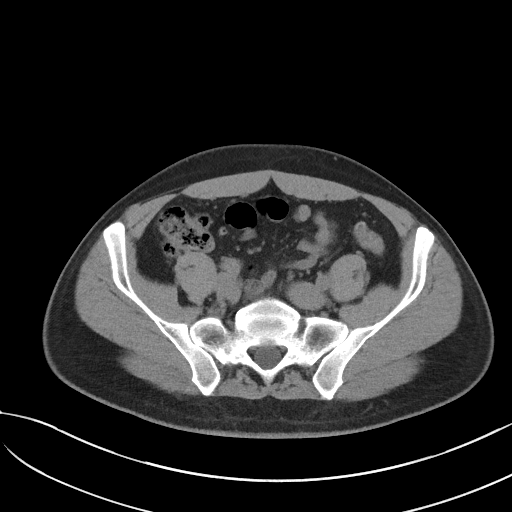
[im 40/87  soft-tissue]
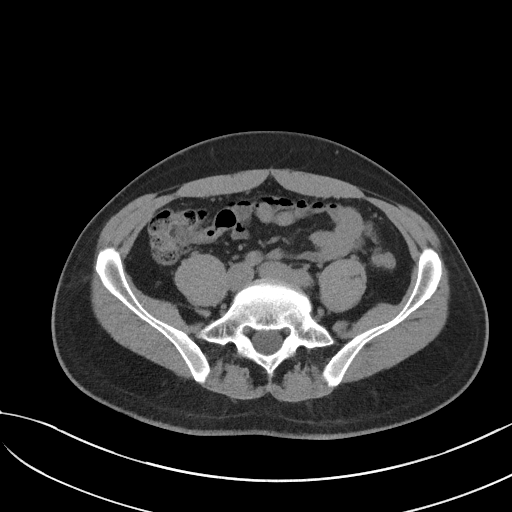
[im 47/87  soft-tissue]
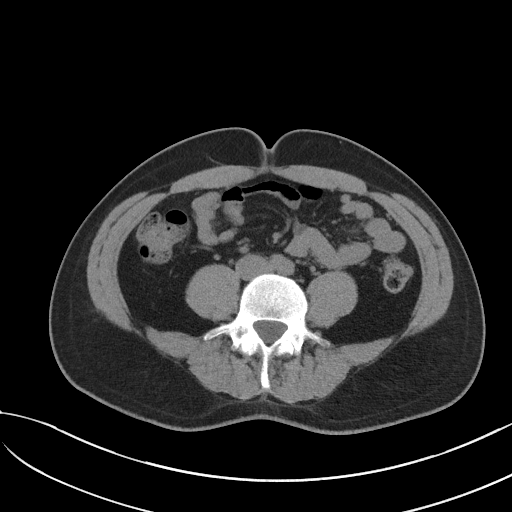
[im 51/87  soft-tissue]
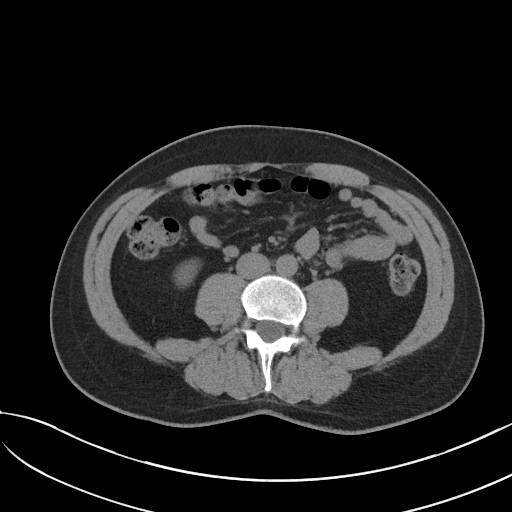
[im 51/87  bone]
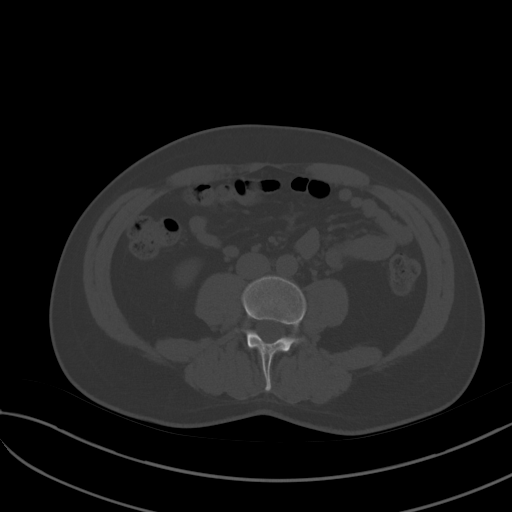
[im 58/87  soft-tissue]
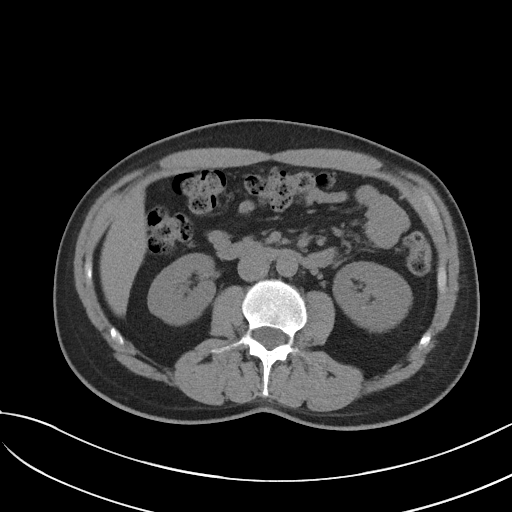
[im 65/87  soft-tissue]
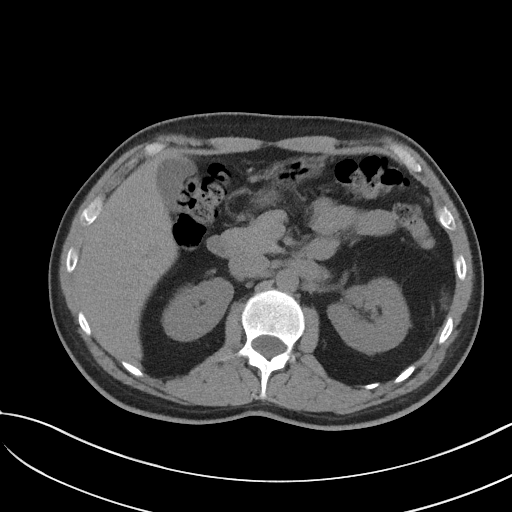
[im 69/87  soft-tissue]
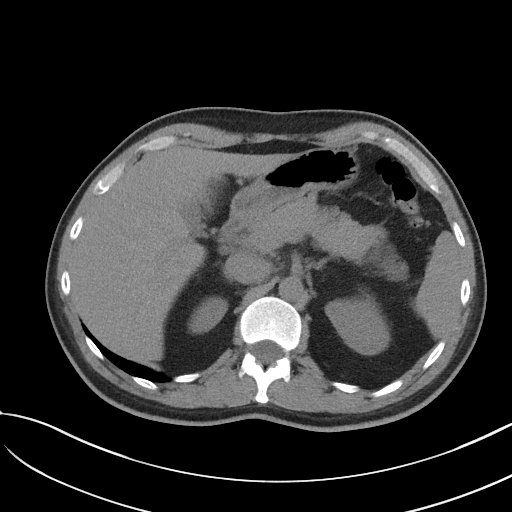
[im 76/87  soft-tissue]
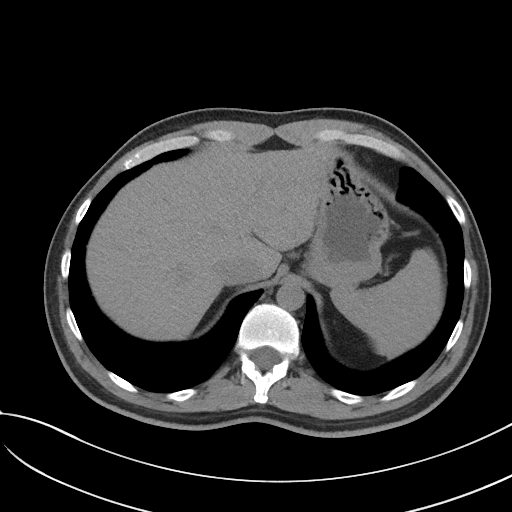
[im 83/87  soft-tissue]
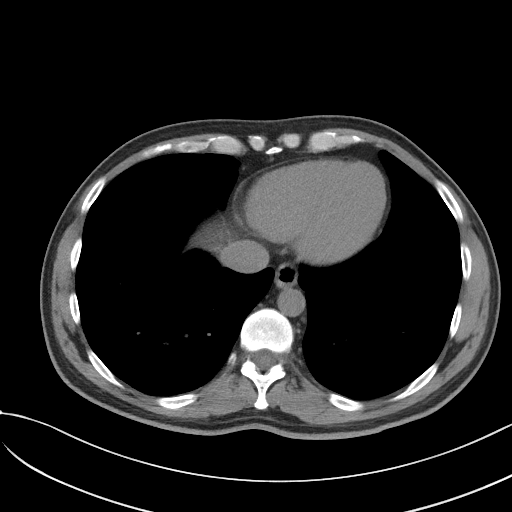

[Series 4: coronal st · coronal · 0.73mm/px · 3 of 86 slices shown]
[im 29/86  soft-tissue]
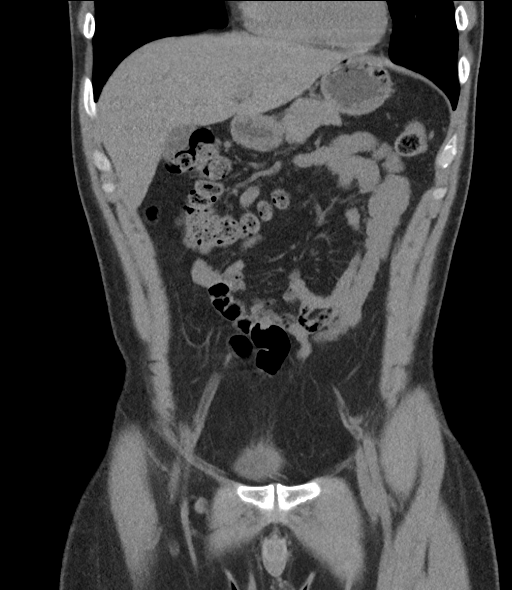
[im 38/86  soft-tissue]
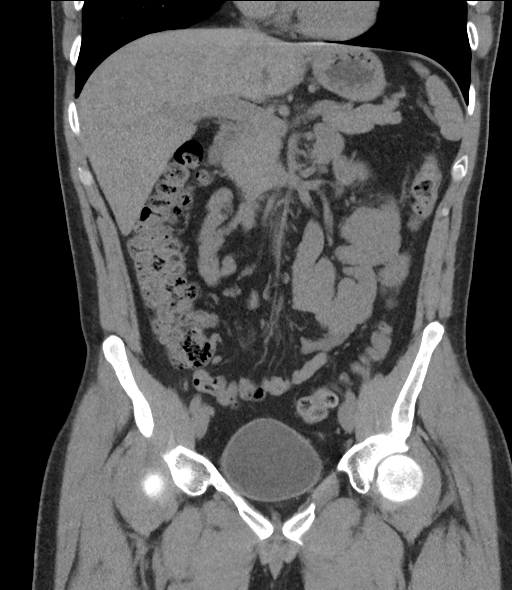
[im 48/86  soft-tissue]
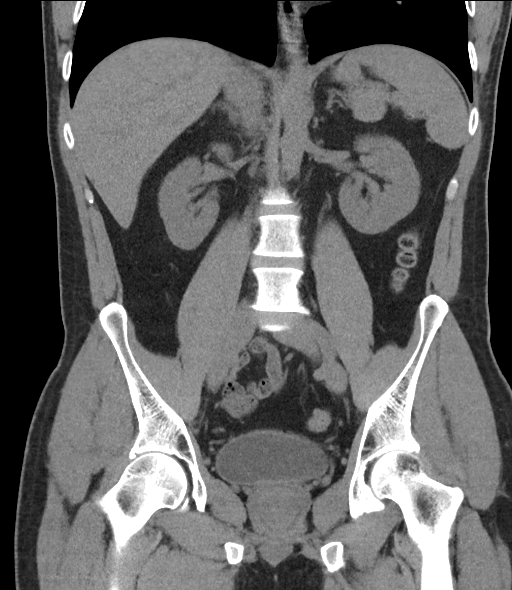

[17 of 46 positions shown; findings below may reference images not displayed]

FINDINGS: Lower chest: No acute abnormality.

Evaluation of the abdominal viscera somewhat limited by the lack of
IV contrast.

Hepatobiliary: No focal liver abnormality is seen. No gallstones,
gallbladder wall thickening, or biliary dilatation.

Pancreas: Unremarkable. No surrounding inflammatory changes.

Spleen: Normal in size without focal abnormality.

Adrenals/Urinary Tract: Adrenal glands are unremarkable. Kidneys are
symmetric without renal calculi or hydronephrosis. Bladder is
unremarkable.

Stomach/Bowel: Stomach is within normal limits. Appendix appears
normal. No evidence of bowel wall thickening, distention, or
inflammatory changes.

Vascular/Lymphatic: No significant vascular findings are present. No
enlarged abdominal or pelvic lymph nodes.

Reproductive: Prostate is unremarkable.

Other: No abdominal wall hernia or abnormality. No abdominopelvic
ascites.

Musculoskeletal: Degenerative disc disease in the lower lumbar
spine.
IMPRESSION: No evidence of acute intra-abdominal pathology on a noncontrast
study.

## 2021-12-19 ENCOUNTER — Telehealth: Payer: 59

## 2022-01-07 ENCOUNTER — Encounter (HOSPITAL_BASED_OUTPATIENT_CLINIC_OR_DEPARTMENT_OTHER): Payer: Self-pay

## 2022-01-07 ENCOUNTER — Ambulatory Visit (HOSPITAL_BASED_OUTPATIENT_CLINIC_OR_DEPARTMENT_OTHER): Payer: 59 | Admitting: Family Medicine

## 2022-01-21 ENCOUNTER — Encounter (HOSPITAL_BASED_OUTPATIENT_CLINIC_OR_DEPARTMENT_OTHER): Payer: Self-pay | Admitting: Family Medicine

## 2022-02-18 ENCOUNTER — Encounter (HOSPITAL_BASED_OUTPATIENT_CLINIC_OR_DEPARTMENT_OTHER): Payer: Self-pay | Admitting: Family Medicine

## 2022-02-18 ENCOUNTER — Ambulatory Visit (INDEPENDENT_AMBULATORY_CARE_PROVIDER_SITE_OTHER): Payer: 59 | Admitting: Family Medicine

## 2022-02-18 DIAGNOSIS — G8929 Other chronic pain: Secondary | ICD-10-CM | POA: Diagnosis not present

## 2022-02-18 DIAGNOSIS — R079 Chest pain, unspecified: Secondary | ICD-10-CM | POA: Diagnosis not present

## 2022-02-18 DIAGNOSIS — Z0000 Encounter for general adult medical examination without abnormal findings: Secondary | ICD-10-CM

## 2022-02-18 DIAGNOSIS — R519 Headache, unspecified: Secondary | ICD-10-CM

## 2022-02-18 NOTE — Progress Notes (Signed)
New Patient Office Visit  Subjective    Patient ID: Mike Mason, male    DOB: [birth year]  Age: 41 y.o. MRN: [id number]  CC:  Chief Complaint  Patient presents with   New Patient (Initial Visit)    Patient presents to establish care. He would like to discuss high heart right. Chronic headachce and neck pain.    HPI Mike Mason presents to establish care Last PCP - has been many years  Feels that his heart rate will be variable - notes that it will increase when he goes to get up and walk but then typically will return back to normal when inactive Feels that he has some chest pain, can be on right or left side, primarily on right side though. Relates an episode when he had an unexpected guest at home, and this situation led to him having some chest pain and slightly elevated heart rate.  Also reports having fairly frequent headache/neck ache. These occur about every other day. Does not take any medications for these episodes. When headaches occur, they will typically last the remainder of the day. Has not had prior evaluation related to this. Will have on cup of decaf coffee per day.  Patient is originally from [place], has been here since 2005. Patient works at [employer], Clinical biochemist. Outside of work, he enjoys developing, is in school for this, does gardening, exercise.  Outpatient Encounter Medications as of 02/18/2022  Medication Sig   Cetirizine HCl 10 MG CAPS Take 1 capsule (10 mg total) by mouth daily for 10 days.   Dextromethorphan-Benzocaine (CEPACOL SORE THROAT & COUGH) 5-7.5 MG LOZG Use as directed 1 lozenge in the mouth or throat as needed.   predniSONE (STERAPRED UNI-PAK 21 TAB) 10 MG (21) TBPK tablet Take by mouth daily. Take as directed   triamcinolone cream (KENALOG) 0.1 % Apply 1 application topically 2 (two) times daily.   valACYclovir (VALTREX) 1000 MG tablet Take 1 tablet (1,000 mg total) by mouth 3 (three) times daily.   No facility-administered  encounter medications on file as of 02/18/2022.    Past Medical History:  Diagnosis Date   Asthma     History reviewed. No pertinent surgical history.  Family History  Problem Relation Age of Onset   Healthy Mother    Healthy Father     Social History   Socioeconomic History   Marital status: Married    Spouse name: Mike Mason   Number of children: 2   Years of education: Not on file   Highest education level: Not on file  Occupational History   Not on file  Tobacco Use   Smoking status: Never   Smokeless tobacco: Never  Substance and Sexual Activity   Alcohol use: Never   Drug use: Yes    Types: Marijuana    Comment: In is past   Sexual activity: Yes    Birth control/protection: None  Other Topics Concern   Not on file  Social History Narrative   Patient lives with wife and daughter.    Social Determinants of Health   Financial Resource Strain: Not on file  Food Insecurity: Not on file  Transportation Needs: Not on file  Physical Activity: Not on file  Stress: Not on file  Social Connections: Not on file  Intimate Partner Violence: Not on file    Objective    BP 122/82   Pulse 86   Ht 5\' 7"  (1.702 m)   Wt 164 lb (74.4 kg)  SpO2 96%   BMI 25.69 kg/m   Physical Exam  41 year old male in no acute distress Cardiovascular Sam with regular rate and rhythm, no murmur appreciated Lungs clear to auscultation bilaterally  EKG: Sinus rhythm, normal rate, normal QT interval, no T wave abnormality noted  Assessment & Plan:   Problem List Items Addressed This Visit       Other   Chest pain    EKG reassuring today.  Heart rate fluctuation is not necessarily concerning given lack of true symptoms during his episodes and that heart rate will return to normal range at rest Can proceed with checking labs initially with upcoming CPE, including assessing thyroid function as well as checking for any electrolyte derangements Continue to monitor symptoms, if  symptoms are persisting, could consider evaluation with cardiology, consideration of event or Holter monitor      Chronic headaches    Longstanding issue for patient.  Given duration of symptoms, will refer to neurology for further evaluation and recommendations.  Patient likely to benefit from daily medication to help with control of underlying chronic headaches No red flags on history or exam      Relevant Orders   Ambulatory referral to Neurology   Other Visit Diagnoses     Wellness examination       Relevant Orders   CBC with Differential/Platelet   Comprehensive metabolic panel   Lipid panel   TSH Rfx on Abnormal to Free T4       Return in about 6 weeks (around 04/01/2022) for CPE with [facility] a few days prior.   Mike J [place], MD

## 2022-02-18 NOTE — Assessment & Plan Note (Signed)
EKG reassuring today.  Heart rate fluctuation is not necessarily concerning given lack of true symptoms during his episodes and that heart rate will return to normal range at rest Can proceed with checking labs initially with upcoming CPE, including assessing thyroid function as well as checking for any electrolyte derangements Continue to monitor symptoms, if symptoms are persisting, could consider evaluation with cardiology, consideration of event or Holter monitor

## 2022-02-18 NOTE — Assessment & Plan Note (Addendum)
Longstanding issue for patient.  Given duration of symptoms, will refer to neurology for further evaluation and recommendations.  Patient likely to benefit from daily medication to help with control of underlying chronic headaches No red flags on history or exam

## 2022-03-25 ENCOUNTER — Encounter: Payer: Self-pay | Admitting: Neurology

## 2022-03-25 ENCOUNTER — Ambulatory Visit: Payer: 59 | Admitting: Neurology

## 2022-03-25 VITALS — BP 121/84 | HR 66 | Ht 67.0 in | Wt 165.6 lb

## 2022-03-25 DIAGNOSIS — H5461 Unqualified visual loss, right eye, normal vision left eye: Secondary | ICD-10-CM

## 2022-03-25 DIAGNOSIS — G43709 Chronic migraine without aura, not intractable, without status migrainosus: Secondary | ICD-10-CM | POA: Diagnosis not present

## 2022-03-25 DIAGNOSIS — R519 Headache, unspecified: Secondary | ICD-10-CM | POA: Diagnosis not present

## 2022-03-25 DIAGNOSIS — R51 Headache with orthostatic component, not elsewhere classified: Secondary | ICD-10-CM | POA: Diagnosis not present

## 2022-03-25 MED ORDER — RIZATRIPTAN BENZOATE 10 MG PO TBDP: 10.0000 mg | ORAL_TABLET | ORAL | 11 refills | Status: DC | PRN

## 2022-03-25 MED ORDER — ONDANSETRON 4 MG PO TBDP: 4.0000 mg | ORAL_TABLET | Freq: Three times a day (TID) | ORAL | 3 refills | Status: DC | PRN

## 2022-03-25 MED ORDER — NURTEC 75 MG PO TBDP: 75.0000 mg | ORAL_TABLET | Freq: Every day | ORAL | 0 refills | Status: DC | PRN

## 2022-03-25 NOTE — Patient Instructions (Addendum)
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, bring it back in 2 months   Preventatives: In the future recommend Emgality, Ajovy, Ubrelvy and Nurtec - new class of medications that have been spectacular for migraines with a great side effect profile "CGRP antagonists" Before getting CGRP antagonists approved, usually 3 medication class you have to fail: Blood Pressure(propranol), Anti-epilepsy(Topamax), Tricyclic Antidepressants (Amitriptyline/Nortriptyline) which you have already tried  Acutely/emergently/as needed: Triptans classic medications like Sumatriptan or Rizatriptan etc right at onset of a migraine but now we have Nurtec and Ubrelvy and some other new ones   I would recommend trying Emgality or Ajovy as a preventative monthly injections Acute management: try a triptan Rizatriptan and ondansetron. But would recommend nurtec or ubrelvy.  Call for follow up if needed, mychart me if you want to try these medications  Ubrogepant Tablets What is this medication? UBROGEPANT (ue BROE je pant) treats migraines. It works by blocking a substance in the body that causes migraines. It is not used to prevent migraines. This medicine may be used for other purposes; ask your health care provider or pharmacist if you have questions. COMMON BRAND NAME(S): Bernita Raisin What should I tell my care team before I take this medication? They need to know if you have any of these conditions: Kidney disease Liver disease An unusual or allergic reaction to ubrogepant, other medications, foods, dyes, or preservatives Pregnant or trying to get pregnant Breast-feeding How should I use this medication? Take this medication by mouth with a glass of water. Take it as directed on the prescription label. You can take it with or without food. If it upsets your stomach, take it with food. Keep taking it unless your care team tells you to stop. Talk to your care team about the use of this  medication in children. Special care may be needed. Overdosage: If you think you have taken too much of this medicine contact a poison control center or emergency room at once. NOTE: This medicine is only for you. Do not share this medicine with others. What if I miss a dose? This does not apply. This medication is not for regular use. What may interact with this medication? Do not take this medication with any of the following: Adagrasib Ceritinib Certain antibiotics, such as chloramphenicol, clarithromycin, telithromycin Certain antivirals for HIV, such as atazanavir, cobicistat, darunavir, delavirdine, fosamprenavir, indinavir, ritonavir Certain medications for fungal infections, such as itraconazole, ketoconazole, posaconazole, voriconazole Conivaptan Grapefruit Idelalisib Mifepristone Nefazodone Ribociclib This medication may also interact with the following: Carvedilol Certain medications for seizures, such as phenobarbital, phenytoin Ciprofloxacin Cyclosporine Eltrombopag Fluconazole Fluvoxamine Quinidine Rifampin St. John's wort Verapamil This list may not describe all possible interactions. Give your health care provider a list of all the medicines, herbs, non-prescription drugs, or dietary supplements you use. Also tell them if you smoke, drink alcohol, or use illegal drugs. Some items may interact with your medicine. What should I watch for while using this medication? Visit your care team for regular checks on your progress. Tell your care team if your symptoms do not start to get better or if they get worse. Your mouth may get dry. Chewing sugarless gum or sucking hard candy and drinking plenty of water may help. Contact your care team if the problem does not go away or is severe. What side effects may I notice from receiving this medication? Side effects that you should report to your care team as soon as possible: Allergic reactions--skin  rash, itching, hives,  swelling of the face, lips, tongue, or throat Side effects that usually do not require medical attention (report to your care team if they continue or are bothersome): Drowsiness Dry mouth Fatigue Nausea This list may not describe all possible side effects. Call your doctor for medical advice about side effects. You may report side effects to FDA at 1-800-FDA-1088. Where should I keep my medication? Keep out of the reach of children and pets. Store between 15 and 30 degrees C (59 and 86 degrees F). Get rid of any unused medication after the expiration date. To get rid of medications that are no longer needed or have expired: Take the medication to a medication take-back program. Check with your pharmacy or law enforcement to find a location. If you cannot return the medication, check the label or package insert to see if the medication should be thrown out in the garbage or flushed down the toilet. If you are not sure, ask your care team. If it is safe to put it in the trash, pour the medication out of the container. Mix the medication with cat litter, dirt, coffee grounds, or other unwanted substance. Seal the mixture in a bag or container. Put it in the trash. NOTE: This sheet is a summary. It may not cover all possible information. If you have questions about this medicine, talk to your doctor, pharmacist, or health care provider.  2023 Elsevier/Gold Standard (2021-10-03 00:00:00) Rimegepant Disintegrating Tablets What is this medication? RIMEGEPANT (ri ME je pant) prevents and treats migraines. It works by blocking a substance in the body that causes migraines. This medicine may be used for other purposes; ask your health care provider or pharmacist if you have questions. COMMON BRAND NAME(S): NURTEC ODT What should I tell my care team before I take this medication? They need to know if you have any of these conditions: Kidney disease Liver disease An unusual or allergic reaction to  rimegepant, other medications, foods, dyes, or preservatives Pregnant or trying to get pregnant Breast-feeding How should I use this medication? Take this medication by mouth. Take it as directed on the prescription label. Leave the tablet in the sealed pack until you are ready to take it. With dry hands, open the pack and gently remove the tablet. If the tablet breaks or crumbles, throw it away. Use a new tablet. Place the tablet in the mouth and allow it to dissolve. Then, swallow it. Do not cut, crush, or chew this medication. You do not need water to take this medication. Talk to your care team about the use of this medication in children. Special care may be needed. Overdosage: If you think you have taken too much of this medicine contact a poison control center or emergency room at once. NOTE: This medicine is only for you. Do not share this medicine with others. What if I miss a dose? This does not apply. This medication is not for regular use. What may interact with this medication? Certain medications for fungal infections, such as fluconazole, itraconazole Rifampin This list may not describe all possible interactions. Give your health care provider a list of all the medicines, herbs, non-prescription drugs, or dietary supplements you use. Also tell them if you smoke, drink alcohol, or use illegal drugs. Some items may interact with your medicine. What should I watch for while using this medication? Visit your care team for regular checks on your progress. Tell your care team if your symptoms do not start to  get better or if they get worse. What side effects may I notice from receiving this medication? Side effects that you should report to your care team as soon as possible: Allergic reactions--skin rash, itching, hives, swelling of the face, lips, tongue, or throat Side effects that usually do not require medical attention (report to your care team if they continue or are  bothersome): Nausea Stomach pain This list may not describe all possible side effects. Call your doctor for medical advice about side effects. You may report side effects to FDA at 1-800-FDA-1088. Where should I keep my medication? Keep out of the reach of children and pets. Store at room temperature between 20 and 25 degrees C (68 and 77 degrees F). Get rid of any unused medication after the expiration date. To get rid of medications that are no longer needed or have expired: Take the medication to a medication take-back program. Check with your pharmacy or law enforcement to find a location. If you cannot return the medication, check the label or package insert to see if the medication should be thrown out in the garbage or flushed down the toilet. If you are not sure, ask your care team. If it is safe to put it in the trash, take the medication out of the container. Mix the medication with cat litter, dirt, coffee grounds, or other unwanted substance. Seal the mixture in a bag or container. Put it in the trash. NOTE: This sheet is a summary. It may not cover all possible information. If you have questions about this medicine, talk to your doctor, pharmacist, or health care provider.  2023 Elsevier/Gold Standard (2021-10-01 00:00:00) Mike Mason Injection What is this medication? FREMANEZUMAB (fre ma NEZ ue mab) prevents migraines. It works by blocking a substance in the body that causes migraines. It is a monoclonal antibody. This medicine may be used for other purposes; ask your health care provider or pharmacist if you have questions. COMMON BRAND NAME(S): AJOVY What should I tell my care team before I take this medication? They need to know if you have any of these conditions: An unusual or allergic reaction to fremanezumab, other medications, foods, dyes, or preservatives Pregnant or trying to get pregnant Breast-feeding How should I use this medication? This medication is injected  under the skin. You will be taught how to prepare and give it. Take it as directed on the prescription label. Keep taking it unless your care team tells you to stop. It is important that you put your used needles and syringes in a special sharps container. Do not put them in a trash can. If you do not have a sharps container, call your pharmacist or care team to get one. Talk to your care team about the use of this medication in children. Special care may be needed. Overdosage: If you think you have taken too much of this medicine contact a poison control center or emergency room at once. NOTE: This medicine is only for you. Do not share this medicine with others. What if I miss a dose? If you miss a dose, take it as soon as you can. If it is almost time for your next dose, take only that dose. Do not take double or extra doses. What may interact with this medication? Interactions are not expected. This list may not describe all possible interactions. Give your health care provider a list of all the medicines, herbs, non-prescription drugs, or dietary supplements you use. Also tell them if you smoke, drink  alcohol, or use illegal drugs. Some items may interact with your medicine. What should I watch for while using this medication? Tell your care team if your symptoms do not start to get better or if they get worse. What side effects may I notice from receiving this medication? Side effects that you should report to your care team as soon as possible: Allergic reactions or angioedema--skin rash, itching or hives, swelling of the face, eyes, lips, tongue, arms, or legs, trouble swallowing or breathing Side effects that usually do not require medical attention (report to your care team if they continue or are bothersome): Pain, redness, or irritation at injection site This list may not describe all possible side effects. Call your doctor for medical advice about side effects. You may report side  effects to FDA at 1-800-FDA-1088. Where should I keep my medication? Keep out of the reach of children and pets. Store in a refrigerator or at room temperature between 20 and 25 degrees C (68 and 77 degrees F). Refrigeration (preferred): Store in the refrigerator. Do not freeze. Keep in the original container until you are ready to take it. Remove the dose from the carton about 30 minutes before it is time for you to use it. If the dose is not used, it may be stored in the original container at room temperature for 7 days. Get rid of any unused medication after the expiration date. Room Temperature: This medication may be stored at room temperature for up to 7 days. Keep it in the original container. Protect from light until time of use. If it is stored at room temperature, get rid of any unused medication after 7 days or after it expires, whichever is first. To get rid of medications that are no longer needed or have expired: Take the medication to a medication take-back program. Check with your pharmacy or law enforcement to find a location. If you cannot return the medication, ask your pharmacist or care team how to get rid of this medication safely. NOTE: This sheet is a summary. It may not cover all possible information. If you have questions about this medicine, talk to your doctor, pharmacist, or health care provider.  2023 Elsevier/Gold Standard (2021-10-03 00:00:00) Galcanezumab Injection What is this medication? GALCANEZUMAB (gal ka NEZ ue mab) prevents migraines. It works by blocking a substance in the body that causes migraines. It may also be used to treat cluster headaches. It is a monoclonal antibody. This medicine may be used for other purposes; ask your health care provider or pharmacist if you have questions. COMMON BRAND NAME(S): Emgality What should I tell my care team before I take this medication? They need to know if you have any of these conditions: An unusual or allergic  reaction to galcanezumab, other medications, foods, dyes, or preservatives Pregnant or trying to get pregnant Breast-feeding How should I use this medication? This medication is injected under the skin. You will be taught how to prepare and give it. Take it as directed on the prescription label. Keep taking it unless your care team tells you to stop. It is important that you put your used needles and syringes in a special sharps container. Do not put them in a trash can. If you do not have a sharps container, call your pharmacist or care team to get one. Talk to your care team about the use of this medication in children. Special care may be needed. Overdosage: If you think you have taken too much of this  medicine contact a poison control center or emergency room at once. NOTE: This medicine is only for you. Do not share this medicine with others. What if I miss a dose? If you miss a dose, take it as soon as you can. If it is almost time for your next dose, take only that dose. Do not take double or extra doses. What may interact with this medication? Interactions are not expected. This list may not describe all possible interactions. Give your health care provider a list of all the medicines, herbs, non-prescription drugs, or dietary supplements you use. Also tell them if you smoke, drink alcohol, or use illegal drugs. Some items may interact with your medicine. What should I watch for while using this medication? Visit your care team for regular checks on your progress. Tell your care team if your symptoms do not start to get better or if they get worse. What side effects may I notice from receiving this medication? Side effects that you should report to your care team as soon as possible: Allergic reactions or angioedema--skin rash, itching or hives, swelling of the face, eyes, lips, tongue, arms, or legs, trouble swallowing or breathing Side effects that usually do not require medical  attention (report to your care team if they continue or are bothersome): Pain, redness, or irritation at injection site This list may not describe all possible side effects. Call your doctor for medical advice about side effects. You may report side effects to FDA at 1-800-FDA-1088. Where should I keep my medication? Keep out of the reach of children and pets. Store in a refrigerator or at room temperature between 20 and 25 degrees C (68 and 77 degrees F). Refrigeration (preferred): Store in the refrigerator. Do not freeze. Keep in the original container until you are ready to take it. Remove the dose from the carton about 30 minutes before it is time for you to use it. If the dose is not used, it may be stored in original container at room temperature for 7 days. Get rid of any unused medication after the expiration date. Room Temperature: This medication may be stored at room temperature for up to 7 days. Keep it in the original container. Protect from light until time of use. If it is stored at room temperature, get rid of any unused medication after 7 days or after it expires, whichever is first. To get rid of medications that are no longer needed or have expired: Take the medication to a medication take-back program. Check with your pharmacy or law enforcement to find a location. If you cannot return the medication, ask your pharmacist or care team how to get rid of this medication safely. NOTE: This sheet is a summary. It may not cover all possible information. If you have questions about this medicine, talk to your doctor, pharmacist, or health care provider.  2023 Elsevier/Gold Standard (2021-10-06 00:00:00)

## 2022-03-25 NOTE — Progress Notes (Signed)
[facility]    Provider:  Dr Lucia Gaskins Requesting Provider: de [place], Buren Kos, MD Primary Care Provider:  de [place], Buren Kos, MD  CC:  migraines  HPI:  Mike Mason is a 41 y.o. male here as requested by de [place], Buren Kos, MD for migraines.  Past medical history chronic headaches, chest pain, asthma.  I reviewed Dr. Lottie Mussel notes, patient was recently seen to establish care, reported he has fairly frequent headaches and neck pain, they occur very frequently, does not take any medications, when the headaches occur they will typically last the remainder of the day, has never had prior evaluation related to this.  Patient is originally from [place], has been here since 2005, works in the Conservation [employer], historic buildings.  Has had headaches as long as he can remember, he has a fhx of migraines. Starts in his neck and is all over and pulsating/pounding/throbbing,  photo/phonophobia,nausea, no vomiting, movement makes it worse. Often will start on the right. A dark, cool quiet room helps, can last up to 24 hours, can be moderate to severe, does not take medicine, over the last year 20-25 headache days a month and at least 15 are moderate to severe migraines. Worsening. 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 The quality, severity, freq of headaches has worsened. He has tried exercising and watches what he eats. No other focal neurologic deficits, associated symptoms, inciting events or modifiable factors.   Reviewed notes, labs and imaging from outside physicians, which showed:  Propranolol contraindicated due to asthma. Tried imitrex had side effects. Also tried and failed topiramate and amitriptyline.   Review of Systems: Patient complains of symptoms per HPI as well as the following symptoms ros. Pertinent negatives and positives per HPI. All others negative.  +apd right. Shadows and colors on right  only. Exotropia right. EOMI   Social History   Socioeconomic History   Marital status: Married    Spouse name: Precious   Number of children: 2   Years of education: Not on file   Highest education level: Not on file  Occupational History   Not on file  Tobacco Use   Smoking status: Never   Smokeless tobacco: Never  Substance and Sexual Activity   Alcohol use: Never   Drug use: Yes    Types: Marijuana    Comment: In is past   Sexual activity: Yes    Birth control/protection: None  Other Topics Concern   Not on file  Social History Narrative   Patient lives with wife and daughter.    Social Determinants of Health   Financial Resource Strain: Not on file  Food Insecurity: Not on file  Transportation Needs: Not on file  Physical Activity: Not on file  Stress: Not on file  Social Connections: Not on file  Intimate Partner Violence: Not on file    Family History  Problem Relation Age of Onset   Healthy Mother    Migraines Mother    Healthy Father    Migraines Sister    Migraines Maternal Aunt    Migraines Maternal Grandmother     Past Medical History:  Diagnosis Date   Asthma     Patient Active Problem List   Diagnosis Date Noted   Chronic migraine without aura without status migrainosus, not intractable 03/25/2022   Chest pain 02/18/2022   Chronic headaches 02/18/2022  History reviewed. No pertinent surgical history.  Current Outpatient Medications  Medication Sig Dispense Refill   Acetaminophen (TYLENOL PO) Take by mouth as needed.     APPLE CIDER VINEGAR PO Take by mouth.     Multiple Vitamin (ONE-A-DAY MENS PO) Take by mouth.     ondansetron (ZOFRAN-ODT) 4 MG disintegrating tablet Take 1-2 tablets (4-8 mg total) by mouth every 8 (eight) hours as needed. For nausea can take with rizatriptan 30 tablet 3   Rimegepant Sulfate (NURTEC) 75 MG TBDP Take 75 mg by mouth daily as needed. For migraines. Take as close to onset of migraine as possible. One  daily maximum. 6 tablet 0   rizatriptan (MAXALT-MLT) 10 MG disintegrating tablet Take 1 tablet (10 mg total) by mouth as needed for migraine. May repeat in 2 hours if needed 9 tablet 11   No current facility-administered medications for this visit.    Allergies as of 03/25/2022   (No Known Allergies)    Vitals: BP 121/84   Pulse 66   Ht 5\' 7"  (1.702 m)   Wt 165 lb 9.6 oz (75.1 kg)   BMI 25.94 kg/m  Last Weight:  Wt Readings from Last 1 Encounters:  03/25/22 165 lb 9.6 oz (75.1 kg)   Last Height:   Ht Readings from Last 1 Encounters:  03/25/22 5\' 7"  (1.702 m)     Physical exam: Exam: Gen: NAD, conversant, well nourised, well groomed                     CV: RRR, no MRG. No Carotid Bruits. No peripheral edema, warm, nontender Eyes: Conjunctivae clear without exudates or hemorrhage  Neuro: Detailed Neurologic Exam  Speech:    Speech is normal; fluent and spontaneous with normal comprehension.  Cognition:    The patient is oriented to person, place, and time;     recent and remote memory intact;     language fluent;     normal attention, concentration,     fund of knowledge Cranial Nerves:    Left pupil round, and reactive to light. Right pupil irregular. +apd right. Shadows and colors on right only. Left eye intact vision. Exotropia right. EOMI. attempted, pupils too small to visualize fundi. Trigeminal sensation is intact and the muscles of mastication are normal. The face is symmetric. The palate elevates in the midline. Hearing intact. Voice is normal. Shoulder shrug is normal. The tongue has normal motion without fasciculations.   Coordination: Normal Gait: Normal Motor Observation:    No asymmetry, no atrophy, and no involuntary movements noted. Tone:    Normal muscle tone.    Posture:    Posture is normal. normal erect    Strength:    Strength is V/V in the upper and lower limbs.      Sensation: intact to LT     Reflex Exam:  DTR's:    Deep tendon  reflexes in the upper and lower extremities are normal bilaterally.   Toes:    The toes are downgoing bilaterally.   Clonus:    Clonus is absent.    Assessment/Plan: 41 year old patient who has had migraines "as long as I can remember", has a family history of migraines,, likely chronic migraines but given these concerning symptoms needs a thorough evaluation.  We had an extended visit today talking about migraines and migraine management, I let him borrow Cefaly to try that, he will review of the literature I gave him and MyChart me with decisions on  management.  He did agree to an MRI of the brain and blood work today.  I would recommend trying Emgality or Ajovy as a preventative monthly injections Acute management: try a triptan Rizatriptan and ondansetron. But would recommend nurtec or ubrelvy next.  Call for follow up if needed, mychart me if you want to try these medications. Propranolol contraindicated due to asthma. Tried imitrex had side effects. Also tried and failed topiramate and amitriptyline.   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, bring it back in 2 months   Discussed:   Preventatives: In the future recommend Emgality, Ajovy, Ubrelvy and Nurtec - new class of medications that have been spectacular for migraines with a great side effect profile "CGRP antagonists" Before getting CGRP antagonists approved, usually 3 medication class you have to fail: Blood Pressure(propranol), Anti-epilepsy(Topamax), Tricyclic Antidepressants (Amitriptyline/Nortriptyline) which you have already tried  Acutely/emergently/as needed: Triptans classic medications like  Sumatriptan or Rizatriptan etc right at onset of a migraine but now we have Nurtec and Ubrelvy and some other new ones  Discussed: To prevent or relieve headaches, try the following: Cool Compress. Lie down and place a cool compress on your head.  Avoid headache triggers. If certain foods or odors seem to have triggered your migraines in the past, avoid them. A headache diary might help you identify triggers.  Include physical activity in your daily routine. Try a daily walk or other moderate aerobic exercise.  Manage stress. Find healthy ways to cope with the stressors, such as delegating tasks on your to-do list.  Practice relaxation techniques. Try deep breathing, yoga, massage and visualization.  Eat regularly. Eating regularly scheduled meals and maintaining a healthy diet might help prevent headaches. Also, drink plenty of fluids.  Follow a regular sleep schedule. Sleep deprivation might contribute to headaches Consider biofeedback. With this mind-body technique, you learn to control certain bodily functions -- such as muscle tension, heart rate and blood pressure -- to prevent headaches or reduce headache pain.    Proceed to emergency room if you experience new or worsening symptoms or symptoms do not resolve, if you have new neurologic symptoms or if headache is severe, or for any concerning symptom.   Provided education and documentation from American headache Society toolbox including articles on: chronic migraine medication overuse headache, chronic migraines, prevention of migraines, behavioral and other nonpharmacologic treatments for headache.  Orders Placed This Encounter  Procedures   MR BRAIN W WO CONTRAST   TSH Rfx on Abnormal to Free T4   CBC with Differential/Platelets   Comprehensive metabolic panel   Lipid Panel   Meds ordered this encounter  Medications   rizatriptan (MAXALT-MLT) 10 MG disintegrating tablet    Sig: Take 1 tablet (10 mg total) by mouth as needed for  migraine. May repeat in 2 hours if needed    Dispense:  9 tablet    Refill:  11   ondansetron (ZOFRAN-ODT) 4 MG disintegrating tablet    Sig: Take 1-2 tablets (4-8 mg total) by mouth every 8 (eight) hours as needed. For nausea can take with rizatriptan    Dispense:  30 tablet    Refill:  3   Rimegepant Sulfate (NURTEC) 75 MG TBDP    Sig: Take 75 mg by mouth  daily as needed. For migraines. Take as close to onset of migraine as possible. One daily maximum.    Dispense:  6 tablet    Refill:  0    Cc: de [place], Buren Kos, MD,  de [place], Buren Kos, MD  Naomie Dean, MD  [facility] [address] [place], [place] [address]  Phone [phone] Fax [phone]  I spent over 60 minutes of face-to-face and non-face-to-face time with patient on the  1. Chronic migraine without aura without status migrainosus, not intractable   2. Vision loss of right eye   3. Worsening headaches   4. Morning headache   5. Positional headache    diagnosis.  This included previsit chart review, lab review, study review, order entry, electronic health record documentation, patient education on the different diagnostic and therapeutic options, counseling and coordination of care, risks and benefits of management, compliance, or risk factor reduction

## 2022-03-26 LAB — CBC WITH DIFFERENTIAL/PLATELET
Basophils Absolute: 0.1 10*3/uL (ref 0.0–0.2)
Basos: 1 %
EOS (ABSOLUTE): 0.2 10*3/uL (ref 0.0–0.4)
Eos: 4 %
Hematocrit: 44.6 % (ref 37.5–51.0)
Hemoglobin: 14.7 g/dL (ref 13.0–17.7)
Immature Grans (Abs): 0 10*3/uL (ref 0.0–0.1)
Immature Granulocytes: 0 %
Lymphocytes Absolute: 1.4 10*3/uL (ref 0.7–3.1)
Lymphs: 30 %
MCH: 27.5 pg (ref 26.6–33.0)
MCHC: 33 g/dL (ref 31.5–35.7)
MCV: 84 fL (ref 79–97)
Monocytes Absolute: 0.3 10*3/uL (ref 0.1–0.9)
Monocytes: 7 %
Neutrophils Absolute: 2.8 10*3/uL (ref 1.4–7.0)
Neutrophils: 58 %
Platelets: 200 10*3/uL (ref 150–450)
RBC: 5.34 x10E6/uL (ref 4.14–5.80)
RDW: 12.6 % (ref 11.6–15.4)
WBC: 4.8 10*3/uL (ref 3.4–10.8)

## 2022-03-26 LAB — COMPREHENSIVE METABOLIC PANEL
ALT: 15 IU/L (ref 0–44)
AST: 17 IU/L (ref 0–40)
Albumin/Globulin Ratio: 2.2 (ref 1.2–2.2)
Albumin: 5.1 g/dL (ref 4.1–5.1)
Alkaline Phosphatase: 74 IU/L (ref 44–121)
BUN/Creatinine Ratio: 9 (ref 9–20)
BUN: 8 mg/dL (ref 6–24)
Bilirubin Total: 0.7 mg/dL (ref 0.0–1.2)
CO2: 26 mmol/L (ref 20–29)
Calcium: 9.6 mg/dL (ref 8.7–10.2)
Chloride: 102 mmol/L (ref 96–106)
Creatinine, Ser: 0.86 mg/dL (ref 0.76–1.27)
Globulin, Total: 2.3 g/dL (ref 1.5–4.5)
Glucose: 91 mg/dL (ref 70–99)
Potassium: 4.4 mmol/L (ref 3.5–5.2)
Sodium: 142 mmol/L (ref 134–144)
Total Protein: 7.4 g/dL (ref 6.0–8.5)
eGFR: 112 mL/min/{1.73_m2} (ref >59)

## 2022-03-26 LAB — LIPID PANEL
Chol/HDL Ratio: 3.4 ratio (ref 0.0–5.0)
Cholesterol, Total: 165 mg/dL (ref 100–199)
HDL: 49 mg/dL (ref >39)
LDL Chol Calc (NIH): 93 mg/dL (ref 0–99)
Triglycerides: 132 mg/dL (ref 0–149)
VLDL Cholesterol Cal: 23 mg/dL (ref 5–40)

## 2022-03-26 LAB — TSH RFX ON ABNORMAL TO FREE T4: TSH: 1.31 u[IU]/mL (ref 0.450–4.500)

## 2022-04-15 ENCOUNTER — Ambulatory Visit (HOSPITAL_BASED_OUTPATIENT_CLINIC_OR_DEPARTMENT_OTHER): Payer: 59

## 2022-04-15 DIAGNOSIS — Z0000 Encounter for general adult medical examination without abnormal findings: Secondary | ICD-10-CM

## 2022-04-16 LAB — COMPREHENSIVE METABOLIC PANEL
ALT: 12 IU/L (ref 0–44)
AST: 15 IU/L (ref 0–40)
Albumin/Globulin Ratio: 1.9 (ref 1.2–2.2)
Albumin: 4.7 g/dL (ref 4.1–5.1)
Alkaline Phosphatase: 82 IU/L (ref 44–121)
BUN/Creatinine Ratio: 11 (ref 9–20)
BUN: 11 mg/dL (ref 6–24)
Bilirubin Total: 0.6 mg/dL (ref 0.0–1.2)
CO2: 23 mmol/L (ref 20–29)
Calcium: 9.8 mg/dL (ref 8.7–10.2)
Chloride: 103 mmol/L (ref 96–106)
Creatinine, Ser: 1.02 mg/dL (ref 0.76–1.27)
Globulin, Total: 2.5 g/dL (ref 1.5–4.5)
Glucose: 95 mg/dL (ref 70–99)
Potassium: 4 mmol/L (ref 3.5–5.2)
Sodium: 139 mmol/L (ref 134–144)
Total Protein: 7.2 g/dL (ref 6.0–8.5)
eGFR: 95 mL/min/{1.73_m2} (ref >59)

## 2022-04-16 LAB — CBC WITH DIFFERENTIAL/PLATELET
Basophils Absolute: 0 10*3/uL (ref 0.0–0.2)
Basos: 1 %
EOS (ABSOLUTE): 0.1 10*3/uL (ref 0.0–0.4)
Eos: 3 %
Hematocrit: 43.3 % (ref 37.5–51.0)
Hemoglobin: 14.7 g/dL (ref 13.0–17.7)
Immature Grans (Abs): 0 10*3/uL (ref 0.0–0.1)
Immature Granulocytes: 0 %
Lymphocytes Absolute: 1.7 10*3/uL (ref 0.7–3.1)
Lymphs: 36 %
MCH: 28.1 pg (ref 26.6–33.0)
MCHC: 33.9 g/dL (ref 31.5–35.7)
MCV: 83 fL (ref 79–97)
Monocytes Absolute: 0.3 10*3/uL (ref 0.1–0.9)
Monocytes: 6 %
Neutrophils Absolute: 2.6 10*3/uL (ref 1.4–7.0)
Neutrophils: 54 %
Platelets: 198 10*3/uL (ref 150–450)
RBC: 5.23 x10E6/uL (ref 4.14–5.80)
RDW: 12.8 % (ref 11.6–15.4)
WBC: 4.8 10*3/uL (ref 3.4–10.8)

## 2022-04-16 LAB — LIPID PANEL
Chol/HDL Ratio: 3.3 ratio (ref 0.0–5.0)
Cholesterol, Total: 155 mg/dL (ref 100–199)
HDL: 47 mg/dL (ref >39)
LDL Chol Calc (NIH): 93 mg/dL (ref 0–99)
Triglycerides: 75 mg/dL (ref 0–149)
VLDL Cholesterol Cal: 15 mg/dL (ref 5–40)

## 2022-04-16 LAB — TSH RFX ON ABNORMAL TO FREE T4: TSH: 1.56 u[IU]/mL (ref 0.450–4.500)

## 2022-04-22 ENCOUNTER — Encounter (HOSPITAL_BASED_OUTPATIENT_CLINIC_OR_DEPARTMENT_OTHER): Payer: Self-pay | Admitting: Family Medicine

## 2022-04-22 ENCOUNTER — Ambulatory Visit (INDEPENDENT_AMBULATORY_CARE_PROVIDER_SITE_OTHER): Payer: 59 | Admitting: Family Medicine

## 2022-04-22 DIAGNOSIS — Z0000 Encounter for general adult medical examination without abnormal findings: Secondary | ICD-10-CM | POA: Diagnosis not present

## 2022-04-22 NOTE — Progress Notes (Signed)
Subjective:    CC: Annual Physical Exam  HPI: Mike Mason is a 41 y.o. presenting for annual physical  I reviewed the past medical history, family history, social history, surgical history, and allergies today and no changes were needed.  Please see the problem list section below in epic for further details.  Past Medical History: Past Medical History:  Diagnosis Date   Asthma    Past Surgical History: History reviewed. No pertinent surgical history. Social History: Social History   Socioeconomic History   Marital status: Married    Spouse name: Precious   Number of children: 2   Years of education: Not on file   Highest education level: Not on file  Occupational History   Not on file  Tobacco Use   Smoking status: Never   Smokeless tobacco: Never  Substance and Sexual Activity   Alcohol use: Never   Drug use: Yes    Types: Marijuana    Comment: In is past   Sexual activity: Yes    Birth control/protection: None  Other Topics Concern   Not on file  Social History Narrative   Patient lives with wife and daughter.    Social Determinants of Health   Financial Resource Strain: Not on file  Food Insecurity: Not on file  Transportation Needs: Not on file  Physical Activity: Not on file  Stress: Not on file  Social Connections: Not on file   Family History: Family History  Problem Relation Age of Onset   Healthy Mother    Migraines Mother    Healthy Father    Migraines Sister    Migraines Maternal Aunt    Migraines Maternal Grandmother    Allergies: No Known Allergies Medications: See med rec.  Review of Systems: No headache, visual changes, nausea, vomiting, diarrhea, constipation, dizziness, abdominal pain, skin rash, fevers, chills, night sweats, swollen lymph nodes, weight loss, chest pain, body aches, joint swelling, muscle aches, shortness of breath, mood changes, visual or auditory hallucinations.  Objective:    BP (!) 152/83   Pulse 81    Temp 97.6 F (36.4 C) (Oral)   Ht 5\' 7"  (1.702 m)   Wt 157 lb (71.2 kg)   SpO2 100%   BMI 24.59 kg/m   General: Well Developed, well nourished, and in no acute distress. Neuro: Alert and oriented x3, extra-ocular muscles intact, sensation grossly intact. Cranial nerves II through XII are intact, motor, sensory, and coordinative functions are all intact. HEENT: Normocephalic, atraumatic, pupils equal round reactive to light, neck supple, no masses, no lymphadenopathy, thyroid nonpalpable. Oropharynx, nasopharynx, external ear canals are unremarkable. Skin: Warm and dry, no rashes noted. Cardiac: Regular rate and rhythm, no murmurs rubs or gallops. Respiratory: Clear to auscultation bilaterally. Not using accessory muscles, speaking in full sentences. Abdominal: Soft, nontender, nondistended, positive bowel sounds, no masses, no organomegaly. Musculoskeletal: Shoulder, elbow, wrist, hip, knee, ankle stable, and with full range of motion.  Impression and Recommendations:    Wellness examination Routine HCM labs reviewed. HCM reviewed/discussed. Anticipatory guidance regarding healthy weight, lifestyle and choices given. Recommend healthy diet.  Recommend approximately 150 minutes/week of moderate intensity exercise Recommend regular dental and vision exams Always use seatbelt/lap and shoulder restraints Recommend using smoke alarms and checking batteries at least twice a year Recommend using sunscreen when outside Discussed tetanus immunization recommendations, patient declines Recommend seasonal influenza vaccination, patient declines  Return in about 6 months (around 10/22/2022) for blood pressure.   ___________________________________________ Oren de 10/24/2022, MD, ABFM, CAQSM  [facility] [place]

## 2022-04-22 NOTE — Assessment & Plan Note (Signed)
Routine HCM labs reviewed. HCM reviewed/discussed. Anticipatory guidance regarding healthy weight, lifestyle and choices given. Recommend healthy diet.  Recommend approximately 150 minutes/week of moderate intensity exercise Recommend regular dental and vision exams Always use seatbelt/lap and shoulder restraints Recommend using smoke alarms and checking batteries at least twice a year Recommend using sunscreen when outside Discussed tetanus immunization recommendations, patient declines Recommend seasonal influenza vaccination, patient declines

## 2022-04-22 NOTE — Patient Instructions (Signed)
  Medication Instructions:  Your physician recommends that you continue on your current medications as directed. Please refer to the Current Medication list given to you today. --If you need a refill on any your medications before your next appointment, please call your pharmacy first. If no refills are authorized on file call the office.-- Lab Work: Your physician has recommended that you have lab work today: No If you have labs (blood work) drawn today and your tests are completely normal, you will receive your results via MyChart message OR a phone call from our staff.  Please ensure you check your voicemail in the event that you authorized detailed messages to be left on a delegated number. If you have any lab test that is abnormal or we need to change your treatment, we will call you to review the results.  Referrals/Procedures/Imaging: No  Follow-Up: Your next appointment:   Your physician recommends that you schedule a follow-up appointment in: 6 months with Dr. de [place].  You will receive a text message or e-mail with a link to a survey about your care and experience with us today! We would greatly appreciate your feedback!   Thanks for letting us be apart of your health journey!!  Primary Care and [facility] Medicine   Dr. Kentrail de Cuba   We encourage you to activate your patient portal called "MyChart".  Sign up information is provided on this After Visit Summary.  MyChart is used to connect with patients for Virtual Visits (Telemedicine).  Patients are able to view lab/test results, encounter notes, upcoming appointments, etc.  Non-urgent messages can be sent to your provider as well. To learn more about what you can do with MyChart, please visit --  [email or web address].    

## 2022-05-06 ENCOUNTER — Telehealth: Payer: Self-pay | Admitting: *Deleted

## 2022-05-06 NOTE — Telephone Encounter (Signed)
Pt returned CEFALY device.  Did not help per Marcelino Duster, front deck.  No f/u appt made.

## 2022-10-21 ENCOUNTER — Ambulatory Visit (HOSPITAL_BASED_OUTPATIENT_CLINIC_OR_DEPARTMENT_OTHER): Payer: 59 | Admitting: Family Medicine

## 2022-10-29 ENCOUNTER — Ambulatory Visit (HOSPITAL_BASED_OUTPATIENT_CLINIC_OR_DEPARTMENT_OTHER): Payer: 59 | Admitting: Family Medicine

## 2022-11-03 ENCOUNTER — Encounter (HOSPITAL_BASED_OUTPATIENT_CLINIC_OR_DEPARTMENT_OTHER): Payer: Self-pay

## 2022-11-03 ENCOUNTER — Ambulatory Visit (HOSPITAL_BASED_OUTPATIENT_CLINIC_OR_DEPARTMENT_OTHER): Payer: Self-pay | Admitting: Family Medicine

## 2023-01-06 ENCOUNTER — Encounter (HOSPITAL_BASED_OUTPATIENT_CLINIC_OR_DEPARTMENT_OTHER): Payer: Self-pay | Admitting: Family Medicine

## 2023-01-06 ENCOUNTER — Ambulatory Visit (HOSPITAL_BASED_OUTPATIENT_CLINIC_OR_DEPARTMENT_OTHER): Payer: Commercial Managed Care - PPO | Admitting: Family Medicine

## 2023-01-06 VITALS — BP 122/93 | HR 76 | Temp 97.6°F | Ht 67.0 in | Wt 155.0 lb

## 2023-01-06 DIAGNOSIS — K5900 Constipation, unspecified: Secondary | ICD-10-CM

## 2023-01-06 DIAGNOSIS — K921 Melena: Secondary | ICD-10-CM

## 2023-01-06 NOTE — Progress Notes (Signed)
Established Patient Office Visit  Subjective   Patient ID: Mike Mason, male    DOB: [birth year]  Age: 42 y.o. MRN: [id number]  Chief Complaint  Patient presents with   Blood In Stools    Pt here for seeing blood in stools, change of color in stools as well, pt stated he also has been constipated as well    Constipation   Mike Mason is a 42 yo male patient who presents today for concerns with infrequent blood in his stool, constipation x3 days, change in stool consistency, and left-sided abdominal pain x3 weeks.   Reports he has been constipated since Sunday- has not used the bathroom on Mon, Tues or Wed. He reports doing a colon cleanse (ZuPoo) over the weekend and reports that his BM was similar to how it has been in the past "normal." A normal BM for him is soft consistency, not difficult to defecate, and one solid piece. He reports using the bathroom all day on Saturday after the colon cleanse and on Sunday his BM was still runny (thinks it was from the colon cleanse). He reports since then, he has not had a BM- when he usually had them 1-3 times per day. He noticed a change in the frequency of his BMs in the past 6-12 months. Reports he has noticed issues with constipation more often, over the past 6 months.   His diet has changed "for the better"- before dating his wife, was eating pizza and junk food. Now he is consuming fruits, vegetables, working out daily. Stopped drinking milk.  Reports he gets adequate sleep 6-7 hours.   Occasional LLQ abd pain- within the last couple of years, has pain in the morning (once or twice every few months- dull pain with bloating- once he uses the bathroom, it would get better). When he recently had abdominal pain, he felt like the pain lingered- couldn't walk, talk, cough, laugh.   Before issues with abd pain, about 6 months ago, he passed a black stool  Blood within his stool, not bright red or dark red or black.  1-2 months ago, reports he  has seen blood on the toilet paper  Denies night sweats, N/V, weight loss Sometimes BMs are painful  Review of Systems  Constitutional:  Negative for chills, fever, malaise/fatigue and weight loss.  Respiratory:  Negative for cough and shortness of breath.   Cardiovascular:  Negative for chest pain and palpitations.  Gastrointestinal:  Positive for blood in stool and constipation. Negative for abdominal pain, heartburn, melena, nausea and vomiting.  Musculoskeletal:  Negative for myalgias.  Neurological:  Negative for dizziness, weakness and headaches.  Psychiatric/Behavioral:  Negative for suicidal ideas.    Objective:    BP (!) 122/93 (BP Location: Left Arm, Patient Position: Sitting, Cuff Size: Normal)   Pulse 76   Temp 97.6 F (36.4 C) (Oral)   Ht 5\' 7"  (1.702 m)   Wt 155 lb (70.3 kg)   SpO2 99%   BMI 24.28 kg/m  BP Readings from Last 3 Encounters:  01/06/23 (!) 122/93  04/22/22 (!) 152/83  03/25/22 121/84    Physical Exam Constitutional:      Appearance: Normal appearance.  Cardiovascular:     Rate and Rhythm: Normal rate and regular rhythm.     Pulses: Normal pulses.     Heart sounds: Normal heart sounds.  Pulmonary:     Effort: Pulmonary effort is normal.     Breath sounds: Normal breath sounds.  Abdominal:     General: Abdomen is flat. Bowel sounds are normal.     Palpations: Abdomen is soft. There is no mass.     Tenderness: There is no abdominal tenderness. There is no guarding or rebound.  Neurological:     Mental Status: He is alert.  Psychiatric:        Mood and Affect: Mood normal.        Behavior: Behavior normal.        Thought Content: Thought content normal.        Judgment: Judgment normal.    Assessment & Plan:   1. Constipation, unspecified constipation type Patient presents today for ongoing constipation for the past 6 months. He reports noticing blood in his stool " a few times" for the past 6 months. He reports one time that he noticed  his stool was black. He also reports visualizing red blood mixed in his stool. He reports history of abdominal pain in the LLQ- describing the pain as a dull, aching pain that is intermittent. Reports that it has been sharp a few times. Denies abdominal pain during visit. Denies N/V, change in appetite, weight loss, fever/chills, changes in bladder or bowel habits, hematuria, severe pain, or syncopal episodes.  Denies pain associated with eating or food aversion. Patient in no acute distress and is well-appearing. Cardiovascular exam with heart regular rate and rhythm. Normal heart sounds, no murmurs present. No lower extremity edema present. Lungs clear to auscultation bilaterally. Abdominal assessment with soft abdomen, normal bowel sounds, and non-tender to palpation. No rebound tenderness or guarding present.  Advised patient that there is no acute concern for blood loss at this time, due to chronicity of blood in stool and stable vital signs. Will defer labs at this time. Review of previous labs done 6 months ago with normal CBC- normal hemoglobin and hematocrit. Patient prefers to defer assessment of anus for external hemorrhoids and fecal occult blood test (FOBT). Discussed management of constipation and see if blood continues to be present in stool. Counseled patient about further evaluation and management by gastroenterologist specialist. He would prefer to be referred at this time.  - Ambulatory referral to Gastroenterology  2. Blood in stool See #1    Return if symptoms worsen or fail to improve.    Alyson Reedy, FNP

## 2023-07-15 ENCOUNTER — Encounter (HOSPITAL_BASED_OUTPATIENT_CLINIC_OR_DEPARTMENT_OTHER): Payer: Self-pay | Admitting: Family Medicine

## 2024-02-21 ENCOUNTER — Encounter (HOSPITAL_COMMUNITY): Payer: Self-pay | Admitting: *Deleted

## 2024-02-21 ENCOUNTER — Ambulatory Visit (HOSPITAL_COMMUNITY): Admission: EM | Admit: 2024-02-21 | Discharge: 2024-02-21 | Disposition: A | Discharge status: Home / Self Care

## 2024-02-21 DIAGNOSIS — H9122 Sudden idiopathic hearing loss, left ear: Secondary | ICD-10-CM

## 2024-02-21 NOTE — ED Provider Notes (Signed)
 [facility]    CSN: [id number] Arrival date & time: 02/21/24  1339      History   Chief Complaint Chief Complaint  Patient presents with   Hearing Problem    HPI Mike Mason is a 43 y.o. male.   Patient presents with sudden onset hearing loss that occurred around 10 AM this morning.  Patient states that he does occasionally listen to music very loudly in his AirPods.  Patient states that he has had a history of tinnitus in bilateral ears, but then today around 10 AM he was suddenly unable to hear anything from his left ear.  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  Denies ear pain, fever, congestion, and sore throat.  Patient also denies headache, blurred vision, dizziness, slurred speech, weakness, numbness, and confusion.  Denies history of high blood pressure.  The history is provided by the patient and medical records.    Past Medical History:  Diagnosis Date   Asthma     Patient Active Problem List   Diagnosis Date Noted   Constipation 01/06/2023   Blood in stool 01/06/2023   Chronic migraine without aura without status migrainosus, not intractable 03/25/2022   Chest pain 02/18/2022   Chronic headaches 02/18/2022    Past Surgical History:  Procedure Laterality Date   CATARACT EXTRACTION         Home Medications    Prior to Admission medications   Medication Sig Start Date End Date Taking? Authorizing Provider  CREATINE PO Take by mouth.   Yes [provider]  WHEY PROTEIN PO Take by mouth.   Yes [provider]  Acetaminophen (TYLENOL PO) Take by mouth as needed.    [provider]    Family History Family History  Problem Relation Age of Onset   Migraines Mother    Healthy Father    Migraines Sister    Migraines Maternal Grandmother    Migraines Maternal Aunt     Social History Social History   Tobacco Use   Smoking status: Former    Types: Cigarettes   Smokeless tobacco: Never   Vaping Use   Vaping status: Never Used  Substance Use Topics   Alcohol use: Never   Drug use: Not Currently    Types: Marijuana     Allergies   Patient has no known allergies.   Review of Systems Review of Systems  Per HPI  Physical Exam Triage Vital Signs ED Triage Vitals  Encounter Vitals Group     BP 02/21/24 1521 115/81     Girls Systolic BP Percentile --      Girls Diastolic BP Percentile --      Boys Systolic BP Percentile --      Boys Diastolic BP Percentile --      Pulse Rate 02/21/24 1521 77     Resp 02/21/24 1521 16     Temp 02/21/24 1521 98.1 F (36.7 C)     Temp Source 02/21/24 1521 Oral     SpO2 02/21/24 1521 98 %     Weight --      Height --      Head Circumference --      Peak Flow --      Pain Score 02/21/24 1522 0     Pain Loc --      Pain Education --      Exclude from Growth Chart --    No data found.  Updated Vital Signs  BP 115/81   Pulse 77   Temp 98.1 F (36.7 C) (Oral)   Resp 16   SpO2 98%   Visual Acuity Right Eye Distance:   Left Eye Distance:   Bilateral Distance:    Right Eye Near:   Left Eye Near:    Bilateral Near:     Physical Exam Vitals and nursing note reviewed.  Constitutional:      General: He is awake. He is not in acute distress.    Appearance: Normal appearance. He is well-developed and well-groomed. He is not ill-appearing.  HENT:     Right Ear: Tympanic membrane, ear canal and external ear normal.     Left Ear: Tympanic membrane, ear canal and external ear normal.     Nose: Nose normal.     Mouth/Throat:     Mouth: Mucous membranes are moist.     Pharynx: Oropharynx is clear.   Cardiovascular:     Rate and Rhythm: Normal rate and regular rhythm.   Musculoskeletal:        General: Normal range of motion.     Cervical back: Normal range of motion and neck supple.   Skin:    General: Skin is warm and dry.   Neurological:     General: No focal deficit present.     Mental Status: He is alert and  oriented to person, place, and time. Mental status is at baseline.     GCS: GCS eye subscore is 4. GCS verbal subscore is 5. GCS motor subscore is 6.     Cranial Nerves: Cranial nerves 2-12 are intact.     Sensory: Sensation is intact.     Motor: Motor function is intact.     Coordination: Coordination is intact.     Gait: Gait is intact.   Psychiatric:        Behavior: Behavior is cooperative.      UC Treatments / Results  Labs (all labs ordered are listed, but only abnormal results are displayed) Labs Reviewed - No data to display  EKG   Radiology No results found.  Procedures Procedures (including critical care time)  Medications Ordered in UC Medications - No data to display  Initial Impression / Assessment and Plan / UC Course  I have reviewed the triage vital signs and the nursing notes.  Pertinent labs & imaging results that were available during my care of the patient were reviewed by me and considered in my medical decision making (see chart for details).     Patient is overall well-appearing.  Vitals are stable.  No significant findings upon exam.  Bilateral TMs intact and without any signs of infection.  There is no presence of cerumen in either ear canal.  No neurodeficits noted.  GCS 15.  Given patient information for follow-up with audiology.  Discussed follow-up, return, and strict ER precautions. 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  If you develop severe headache, blurry vision, dizziness, weakness, numbness, confusion, slurred speech, or facial droop please seek immediate medical treatment in the emergency department.    ED Prescriptions   None    PDMP not reviewed this encounter.   Johnie Flaming  A, NP 02/21/24 [phone]

## 2024-02-21 NOTE — Discharge Instructions (Signed)
 Call [facility] and schedule an appointment to follow-up regarding your sudden loss.  If you develop severe headache, blurry vision, dizziness, weakness, numbness, confusion, slurred speech, or facial droop please seek immediate medical treatment in the emergency department.

## 2024-02-21 NOTE — ED Triage Notes (Signed)
 States woke up fine today, but around 1000 started with fairly sudden onset  progressive hearing loss in left ear; initially had tinnitus in left ear, but now only has tinnitus if there's a loud noise. Also c/o echo in right ear. Denies any pain.

## 2024-04-02 ENCOUNTER — Other Ambulatory Visit: Payer: Self-pay | Admitting: Medical Genetics

## 2024-06-02 ENCOUNTER — Other Ambulatory Visit: Payer: Self-pay | Admitting: *Deleted

## 2024-06-02 DIAGNOSIS — Z006 Encounter for examination for normal comparison and control in clinical research program: Secondary | ICD-10-CM

## 2024-07-01 LAB — GENECONNECT MOLECULAR SCREEN: Genetic Analysis Overall Interpretation: NEGATIVE
